# Patient Record
Sex: Male | Born: 1980 | Race: White | Hispanic: No
Health system: Midwestern US, Community
[De-identification: ages and names within clinical notes are randomized; demographics above are authoritative.]

## PROBLEM LIST (undated history)

## (undated) DIAGNOSIS — F1010 Alcohol abuse, uncomplicated: Secondary | ICD-10-CM

## (undated) DIAGNOSIS — R569 Unspecified convulsions: Secondary | ICD-10-CM

## (undated) DIAGNOSIS — Z720 Tobacco use: Secondary | ICD-10-CM

## (undated) DIAGNOSIS — N179 Acute kidney failure, unspecified: Secondary | ICD-10-CM

## (undated) DIAGNOSIS — C801 Malignant (primary) neoplasm, unspecified: Secondary | ICD-10-CM

---

## 2005-12-19 ENCOUNTER — Emergency Department (HOSPITAL_COMMUNITY): Admission: EM | Admit: 2005-12-19 | Discharge: 2005-12-19 | Payer: Self-pay | Admitting: Emergency Medicine

## 2006-03-21 ENCOUNTER — Emergency Department (HOSPITAL_COMMUNITY): Admission: EM | Admit: 2006-03-21 | Discharge: 2006-03-21 | Payer: Self-pay | Admitting: Emergency Medicine

## 2006-07-21 ENCOUNTER — Emergency Department (HOSPITAL_COMMUNITY): Admission: EM | Admit: 2006-07-21 | Discharge: 2006-07-21 | Payer: Self-pay | Admitting: Emergency Medicine

## 2006-10-26 ENCOUNTER — Emergency Department (HOSPITAL_COMMUNITY): Admission: EM | Admit: 2006-10-26 | Discharge: 2006-10-26 | Payer: Self-pay | Admitting: Emergency Medicine

## 2006-11-02 ENCOUNTER — Emergency Department (HOSPITAL_COMMUNITY): Admission: EM | Admit: 2006-11-02 | Discharge: 2006-11-02 | Payer: Self-pay | Admitting: Family Medicine

## 2006-11-11 ENCOUNTER — Emergency Department (HOSPITAL_COMMUNITY): Admission: EM | Admit: 2006-11-11 | Discharge: 2006-11-11 | Payer: Self-pay | Admitting: Emergency Medicine

## 2006-12-20 ENCOUNTER — Emergency Department (HOSPITAL_COMMUNITY): Admission: EM | Admit: 2006-12-20 | Discharge: 2006-12-20 | Payer: Self-pay | Admitting: Family Medicine

## 2007-01-04 ENCOUNTER — Emergency Department (HOSPITAL_COMMUNITY): Admission: EM | Admit: 2007-01-04 | Discharge: 2007-01-04 | Payer: Self-pay | Admitting: Family Medicine

## 2007-03-09 ENCOUNTER — Emergency Department (HOSPITAL_COMMUNITY): Admission: EM | Admit: 2007-03-09 | Discharge: 2007-03-09 | Payer: Self-pay | Admitting: Family Medicine

## 2007-08-23 ENCOUNTER — Emergency Department (HOSPITAL_COMMUNITY): Admission: EM | Admit: 2007-08-23 | Discharge: 2007-08-24 | Payer: Self-pay | Admitting: Emergency Medicine

## 2007-09-03 ENCOUNTER — Emergency Department (HOSPITAL_COMMUNITY): Admission: EM | Admit: 2007-09-03 | Discharge: 2007-09-03 | Payer: Self-pay | Admitting: Emergency Medicine

## 2007-12-06 DIAGNOSIS — C801 Malignant (primary) neoplasm, unspecified: Secondary | ICD-10-CM | POA: Diagnosis present

## 2007-12-06 HISTORY — PX: ORCHIECTOMY: SHX2116

## 2007-12-06 HISTORY — DX: Malignant (primary) neoplasm, unspecified: C80.1

## 2008-03-11 ENCOUNTER — Emergency Department (HOSPITAL_COMMUNITY): Admission: EM | Admit: 2008-03-11 | Discharge: 2008-03-11 | Payer: Self-pay | Admitting: Emergency Medicine

## 2008-11-01 ENCOUNTER — Emergency Department (HOSPITAL_COMMUNITY): Admission: EM | Admit: 2008-11-01 | Discharge: 2008-11-01 | Payer: Self-pay | Admitting: Emergency Medicine

## 2009-03-27 ENCOUNTER — Emergency Department (HOSPITAL_COMMUNITY): Admission: EM | Admit: 2009-03-27 | Discharge: 2009-03-27 | Payer: Self-pay | Admitting: Emergency Medicine

## 2009-05-02 ENCOUNTER — Emergency Department (HOSPITAL_COMMUNITY): Admission: EM | Admit: 2009-05-02 | Discharge: 2009-05-02 | Payer: Self-pay | Admitting: Emergency Medicine

## 2009-05-12 ENCOUNTER — Emergency Department (HOSPITAL_COMMUNITY): Admission: EM | Admit: 2009-05-12 | Discharge: 2009-05-12 | Payer: Self-pay | Admitting: Emergency Medicine

## 2009-11-19 ENCOUNTER — Emergency Department (HOSPITAL_COMMUNITY): Admission: EM | Admit: 2009-11-19 | Discharge: 2009-11-19 | Payer: Self-pay | Admitting: Emergency Medicine

## 2010-01-23 ENCOUNTER — Emergency Department (HOSPITAL_COMMUNITY): Admission: EM | Admit: 2010-01-23 | Discharge: 2010-01-23 | Payer: Self-pay | Admitting: Emergency Medicine

## 2010-07-16 ENCOUNTER — Emergency Department (HOSPITAL_COMMUNITY): Admission: EM | Admit: 2010-07-16 | Discharge: 2010-07-16 | Payer: Self-pay | Admitting: Emergency Medicine

## 2010-08-22 ENCOUNTER — Emergency Department (HOSPITAL_COMMUNITY): Admission: EM | Admit: 2010-08-22 | Discharge: 2010-08-22 | Payer: Self-pay | Admitting: Emergency Medicine

## 2010-12-26 ENCOUNTER — Encounter: Payer: Self-pay | Admitting: Urology

## 2011-02-18 LAB — URINALYSIS, ROUTINE W REFLEX MICROSCOPIC
Bilirubin Urine: NEGATIVE
Nitrite: NEGATIVE
Specific Gravity, Urine: 1.012 (ref 1.005–1.030)
pH: 7 (ref 5.0–8.0)

## 2011-03-14 LAB — CBC
HCT: 43.6 % (ref 39.0–52.0)
Hemoglobin: 14.8 g/dL (ref 13.0–17.0)
MCHC: 33.9 g/dL (ref 30.0–36.0)
MCV: 89.3 fL (ref 78.0–100.0)
Platelets: 237 K/uL (ref 150–400)
RBC: 4.88 MIL/uL (ref 4.22–5.81)
RDW: 14.4 % (ref 11.5–15.5)
WBC: 8.1 10*3/uL (ref 4.0–10.5)

## 2011-03-14 LAB — DIFFERENTIAL
Basophils Absolute: 0.1 K/uL (ref 0.0–0.1)
Basophils Relative: 1 % (ref 0–1)
Eosinophils Absolute: 0.1 K/uL (ref 0.0–0.7)
Eosinophils Relative: 1 % (ref 0–5)
Lymphocytes Relative: 19 % (ref 12–46)
Lymphs Abs: 1.5 10*3/uL (ref 0.7–4.0)
Monocytes Absolute: 0.5 10*3/uL (ref 0.1–1.0)
Monocytes Relative: 6 % (ref 3–12)
Neutro Abs: 5.9 K/uL (ref 1.7–7.7)
Neutrophils Relative %: 73 % (ref 43–77)

## 2011-03-14 LAB — URINE MICROSCOPIC-ADD ON

## 2011-03-14 LAB — URINALYSIS, ROUTINE W REFLEX MICROSCOPIC
Bilirubin Urine: NEGATIVE
Glucose, UA: NEGATIVE mg/dL
Ketones, ur: NEGATIVE mg/dL
Leukocytes, UA: NEGATIVE
Nitrite: NEGATIVE
Protein, ur: NEGATIVE mg/dL
Specific Gravity, Urine: 1.021 (ref 1.005–1.030)
Urobilinogen, UA: 0.2 mg/dL (ref 0.0–1.0)
pH: 5.5 (ref 5.0–8.0)

## 2011-03-14 LAB — URINE CULTURE
Colony Count: NO GROWTH
Culture: NO GROWTH

## 2011-03-14 LAB — BASIC METABOLIC PANEL WITH GFR
BUN: 12 mg/dL (ref 6–23)
CO2: 24 meq/L (ref 19–32)
Calcium: 10.2 mg/dL (ref 8.4–10.5)
Chloride: 110 meq/L (ref 96–112)
Creatinine, Ser: 1.06 mg/dL (ref 0.4–1.5)
GFR calc Af Amer: >60 mL/min (ref >60)

## 2011-03-14 LAB — BASIC METABOLIC PANEL
GFR calc non Af Amer: >60 mL/min (ref >60)
Glucose, Bld: 95 mg/dL (ref 70–99)
Potassium: 4.1 mEq/L (ref 3.5–5.1)
Sodium: 142 mEq/L (ref 135–145)

## 2011-04-02 ENCOUNTER — Emergency Department (HOSPITAL_COMMUNITY)
Admission: EM | Admit: 2011-04-02 | Discharge: 2011-04-02 | Disposition: A | Discharge status: Home / Self Care | Payer: Self-pay | Attending: Emergency Medicine | Admitting: Emergency Medicine

## 2011-04-02 DIAGNOSIS — M545 Low back pain, unspecified: Secondary | ICD-10-CM | POA: Insufficient documentation

## 2011-04-02 DIAGNOSIS — M5430 Sciatica, unspecified side: Secondary | ICD-10-CM | POA: Insufficient documentation

## 2011-04-02 DIAGNOSIS — Z87442 Personal history of urinary calculi: Secondary | ICD-10-CM | POA: Insufficient documentation

## 2011-04-05 ENCOUNTER — Emergency Department (HOSPITAL_COMMUNITY): Payer: Self-pay

## 2011-04-05 ENCOUNTER — Emergency Department (HOSPITAL_COMMUNITY)
Admission: EM | Admit: 2011-04-05 | Discharge: 2011-04-05 | Disposition: A | Discharge status: Home / Self Care | Payer: Self-pay | Attending: Emergency Medicine | Admitting: Emergency Medicine

## 2011-04-05 DIAGNOSIS — W219XXA Striking against or struck by unspecified sports equipment, initial encounter: Secondary | ICD-10-CM | POA: Insufficient documentation

## 2011-04-05 DIAGNOSIS — M545 Low back pain, unspecified: Secondary | ICD-10-CM | POA: Insufficient documentation

## 2011-04-05 DIAGNOSIS — Y9361 Activity, american tackle football: Secondary | ICD-10-CM | POA: Insufficient documentation

## 2011-04-05 DIAGNOSIS — Y92838 Other recreation area as the place of occurrence of the external cause: Secondary | ICD-10-CM | POA: Insufficient documentation

## 2011-04-05 DIAGNOSIS — G8929 Other chronic pain: Secondary | ICD-10-CM | POA: Insufficient documentation

## 2011-04-05 DIAGNOSIS — M549 Dorsalgia, unspecified: Secondary | ICD-10-CM | POA: Insufficient documentation

## 2011-04-05 DIAGNOSIS — M5430 Sciatica, unspecified side: Secondary | ICD-10-CM | POA: Insufficient documentation

## 2011-04-05 DIAGNOSIS — Y9239 Other specified sports and athletic area as the place of occurrence of the external cause: Secondary | ICD-10-CM | POA: Insufficient documentation

## 2011-04-05 DIAGNOSIS — M79609 Pain in unspecified limb: Secondary | ICD-10-CM | POA: Insufficient documentation

## 2011-08-30 LAB — CBC
HCT: 44.1
Hemoglobin: 15.6
MCHC: 35.5
MCV: 90.3
Platelets: 232
RDW: 12.5

## 2011-08-30 LAB — DIFFERENTIAL
Basophils Absolute: 0.1
Eosinophils Absolute: 0.2
Eosinophils Relative: 2
Monocytes Absolute: 0.5

## 2011-08-30 LAB — URINALYSIS, ROUTINE W REFLEX MICROSCOPIC
Bilirubin Urine: NEGATIVE
Hgb urine dipstick: NEGATIVE
Ketones, ur: NEGATIVE
Nitrite: NEGATIVE
pH: 7

## 2011-08-30 LAB — BASIC METABOLIC PANEL
BUN: 9
CO2: 23
Chloride: 103
GFR calc non Af Amer: >60
Glucose, Bld: 117 — ABNORMAL HIGH
Potassium: 3.9
Sodium: 135

## 2011-09-06 LAB — URINALYSIS, ROUTINE W REFLEX MICROSCOPIC
Bilirubin Urine: NEGATIVE
Ketones, ur: NEGATIVE
Nitrite: NEGATIVE
Protein, ur: NEGATIVE
pH: 6

## 2011-09-06 LAB — URINE CULTURE
Colony Count: NO GROWTH
Culture: NO GROWTH

## 2015-06-26 ENCOUNTER — Inpatient Hospital Stay
Admit: 2015-06-26 | Discharge: 2015-06-26 | Disposition: A | Discharge status: Home / Self Care | Payer: Self-pay | Attending: Emergency Medicine

## 2015-06-26 DIAGNOSIS — J029 Acute pharyngitis, unspecified: Secondary | ICD-10-CM

## 2015-06-26 MED ORDER — PREDNISONE 10 MG TAB
10 mg | ORAL_TABLET | Freq: Every day | ORAL | Status: DC
Start: 2015-06-26 — End: 2015-07-13

## 2015-06-26 MED ORDER — CLINDAMYCIN 150 MG CAP
150 mg | ORAL_CAPSULE | Freq: Four times a day (QID) | ORAL | Status: AC
Start: 2015-06-26 — End: 2015-07-06

## 2015-06-26 NOTE — ED Notes (Signed)
Pt states that he has a sore throat and difficulty swallowing.

## 2015-06-26 NOTE — ED Notes (Signed)
I have reviewed discharge instructions with the patient.  The patient verbalized understanding.

## 2015-06-26 NOTE — ED Provider Notes (Signed)
HPI Comments: Patient states that he started with a sore throat yesterday and has had some slight difficulty swallowing.  He has had 2 tonsillar abscesses in the past and is concerned about that. He has had no fever, nausea, vomiting, headache, chest pain, shortness of breath, abdominal pain, swelling of his arms or legs or other symptoms.  He has had trouble eating and drinking.    Patient is a 34 y.o. male presenting with sore throat. The history is provided by the patient.   Sore Throat          Past Medical History:   Diagnosis Date   ??? Ill-defined condition      restless legs   ??? Cancer [facility](HCC)      testiclar cancer       Past Surgical History:   Procedure Laterality Date   ??? Hx other surgical       kidney stones removal.   ??? Hx urological       left testicle removal.         History reviewed. No pertinent family history.    History     Social History   ??? Marital Status: SINGLE     Spouse Name: N/A   ??? Number of Children: N/A   ??? Years of Education: N/A     Occupational History   ??? Not on file.     Social History Main Topics   ??? Smoking status: Current Every Day Smoker -- 1.00 packs/day   ??? Smokeless tobacco: Not on file   ??? Alcohol Use: No   ??? Drug Use: Not on file   ??? Sexual Activity: Not on file     Other Topics Concern   ??? Not on file     Social History Narrative   ??? No narrative on file         ALLERGIES: Review of patient's allergies indicates not on file.    Review of Systems   Constitutional: Negative.    HENT: Positive for sore throat.    Eyes: Negative.    Respiratory: Negative.    Cardiovascular: Negative.    Gastrointestinal: Negative.    Genitourinary: Negative.    Musculoskeletal: Negative.    Skin: Negative.    Neurological: Negative.    Psychiatric/Behavioral: Negative.    All other systems reviewed and are negative.      Filed Vitals:    06/26/15 1110   BP: 127/79   Pulse: 107   Temp: 98.1 ??F (36.7 ??C)   Resp: 16   Height: 5\' 11"  (1.803 m)   Weight: 90.719 kg (200 lb)   SpO2: 100%             Physical Exam   Constitutional: He is oriented to person, place, and time. He appears well-developed and well-nourished.   HENT:   Head: Normocephalic and atraumatic.   Right Ear: External ear normal.   Left Ear: External ear normal.   Nose: Nose normal.   Mouth/Throat: Oropharynx is clear and moist.       Eyes: Conjunctivae and EOM are normal. Pupils are equal, round, and reactive to light.   Neck: Normal range of motion. Neck supple.   Cardiovascular: Normal rate, regular rhythm, normal heart sounds and intact distal pulses.    Pulmonary/Chest: Effort normal and breath sounds normal.   Abdominal: Soft. Bowel sounds are normal.   Musculoskeletal: Normal range of motion.   Neurological: He is alert and oriented to person, place, and time. He  has normal reflexes.   Skin: Skin is warm and dry.   Psychiatric: He has a normal mood and affect. His behavior is normal. Judgment and thought content normal.   Nursing note and vitals reviewed.       MDM  Number of Diagnoses or Management Options  Acute pharyngitis, unspecified etiology: minor  Risk of Complications, Morbidity, and/or Mortality  Presenting problems: low  Diagnostic procedures: low  Management options: low    Patient Progress  Patient progress: stable      Procedures  The patient was observed in the ED.  I will treat prophylactically for strep based on the appearance of his throat and/or tonsillitis.  He should follow-up with an ear nose and throat doctor if not doing any better.  Return to the ED if worse.  Drink plenty of fluids.  I discussed the results of all labs, procedures, radiographs, and treatments with the patient and available family.  Treatment plan is agreed upon and the patient is ready for discharge.  All voiced understanding of the discharge plan and medication instructions or changes as appropriate.  Questions about treatment in the ED were answered.  All were encouraged to return should symptoms worsen or new problems develop.

## 2015-07-13 ENCOUNTER — Inpatient Hospital Stay
Admit: 2015-07-13 | Discharge: 2015-07-13 | Disposition: A | Discharge status: Home / Self Care | Payer: Self-pay | Attending: Emergency Medicine

## 2015-07-13 ENCOUNTER — Emergency Department: Admit: 2015-07-13 | Payer: Self-pay

## 2015-07-13 DIAGNOSIS — R109 Unspecified abdominal pain: Secondary | ICD-10-CM

## 2015-07-13 LAB — METABOLIC PANEL, BASIC
Anion gap: 3 mmol/L — ABNORMAL LOW (ref 7–16)
BUN: 11 MG/DL (ref 6–23)
CO2: 27 mmol/L (ref 21–32)
Calcium: 8.4 MG/DL (ref 8.3–10.4)
Chloride: 107 mmol/L (ref 98–107)
Creatinine: 1.18 MG/DL (ref 0.8–1.5)
GFR est AA: >60 mL/min/{1.73_m2} (ref >60)
GFR est non-AA: >60 mL/min/{1.73_m2} (ref >60)
Glucose: 88 mg/dL (ref 65–100)
Potassium: 4.7 mmol/L (ref 3.5–5.1)
Sodium: 137 mmol/L (ref 136–145)

## 2015-07-13 LAB — CBC W/O DIFF
HCT: 38.9 % — ABNORMAL LOW (ref 41.1–50.3)
HGB: 11.8 g/dL — ABNORMAL LOW (ref 13.6–17.2)
MCH: 24.5 PG — ABNORMAL LOW (ref 26.1–32.9)
MCHC: 30.3 g/dL — ABNORMAL LOW (ref 31.4–35.0)
MCV: 80.9 FL (ref 79.6–97.8)
MPV: 11.3 FL (ref 10.8–14.1)
PLATELET: 287 10*3/uL (ref 150–450)
RBC: 4.81 M/uL (ref 4.23–5.67)
RDW: 18.5 % — ABNORMAL HIGH (ref 11.9–14.6)
WBC: 8.1 10*3/uL (ref 4.3–11.1)

## 2015-07-13 LAB — URINALYSIS W/ RFLX MICROSCOPIC
Bilirubin: NEGATIVE
Glucose: NEGATIVE mg/dL
Ketone: NEGATIVE mg/dL
Leukocyte Esterase: NEGATIVE
Nitrites: NEGATIVE
RBC: >100 /hpf
Specific gravity: 1.016 (ref 1.001–1.023)
Urobilinogen: 0.2 EU/dL (ref 0.2–1.0)
pH (UA): 6 (ref 5.0–9.0)

## 2015-07-13 MED ORDER — TRAMADOL 50 MG TAB
50 mg | ORAL_TABLET | Freq: Three times a day (TID) | ORAL | Status: DC | PRN
Start: 2015-07-13 — End: 2015-08-21

## 2015-07-13 MED ORDER — ONDANSETRON 4 MG TAB, RAPID DISSOLVE
4 mg | ORAL | Status: AC
Start: 2015-07-13 — End: 2015-07-13
  Administered 2015-07-13: 15:00:00 via ORAL

## 2015-07-13 MED ORDER — HYDROCODONE-ACETAMINOPHEN 5 MG-325 MG TAB
5-325 mg | Freq: Once | ORAL | Status: AC
Start: 2015-07-13 — End: 2015-07-13
  Administered 2015-07-13: 15:00:00 via ORAL

## 2015-07-13 MED FILL — HYDROCODONE-ACETAMINOPHEN 5 MG-325 MG TAB: 5-325 mg | ORAL | Qty: 1

## 2015-07-13 MED FILL — ONDANSETRON 4 MG TAB, RAPID DISSOLVE: 4 mg | ORAL | Qty: 1

## 2015-07-13 NOTE — ED Notes (Signed)
I have reviewed discharge instructions with the patient.  The patient verbalized understanding. Ambulatory to exit to lobby to call his ride with steady gait.

## 2015-07-13 NOTE — ED Notes (Signed)
Patient gone to xray. Will draw labs upon return.

## 2015-07-13 NOTE — ED Provider Notes (Signed)
HPI Comments: 33 year old gentleman with a history of kidney stones and a remote history of testicular cancer who presents with concerns about right flank pain.  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  He says he will not return to [place] until November.  Patient says that he does have some blood in his urine but that has been intermittently chronic problem since his original orchiectomy.  With his pain patient says that he has not had any fevers, vomiting, diarrhea.    Elements of this note were created using speech recognition software. As such, there may be errors of speech recognition present.    Patient is a 34 y.o. male presenting with flank pain. The history is provided by the patient.   Flank Pain   Pertinent negatives include no chest pain, no fever, no headaches, no abdominal pain, no dysuria and no weakness.        Past Medical History:   Diagnosis Date   ??? Ill-defined condition      restless legs   ??? Cancer [facility])      testiclar cancer       Past Surgical History:   Procedure Laterality Date   ??? Hx other surgical       kidney stones removal.   ??? Hx urological       left testicle removal.         History reviewed. No pertinent family history.    History     Social History   ??? Marital Status: SINGLE     Spouse Name: N/A   ??? Number of Children: N/A   ??? Years of Education: N/A     Occupational History   ??? Not on file.     Social History Main Topics   ??? Smoking status: Current Every Day Smoker -- 1.00 packs/day   ??? Smokeless tobacco: Not on file   ??? Alcohol Use: No   ??? Drug Use: Not on file   ??? Sexual Activity: Not on file     Other Topics Concern   ??? Not on file     Social History Narrative         ALLERGIES: Review of patient's allergies indicates no known allergies.    Review of Systems   Constitutional: Negative for fever, chills and diaphoresis.    HENT: Negative for congestion, rhinorrhea and sore throat.    Eyes: Negative for redness and visual disturbance.   Respiratory: Negative for cough, chest tightness, shortness of breath and wheezing.    Cardiovascular: Negative for chest pain and palpitations.   Gastrointestinal: Negative for nausea, vomiting, abdominal pain, diarrhea and blood in stool.   Endocrine: Negative for polydipsia and polyuria.   Genitourinary: Positive for flank pain. Negative for dysuria and hematuria.   Musculoskeletal: Negative for myalgias, arthralgias and neck stiffness.   Skin: Negative for rash.   Allergic/Immunologic: Negative for environmental allergies and food allergies.   Neurological: Negative for dizziness, weakness and headaches.   Hematological: Negative for adenopathy. Does not bruise/bleed easily.   Psychiatric/Behavioral: Negative for confusion and sleep disturbance. The patient is not nervous/anxious.        Filed Vitals:    07/13/15 0912   BP: 124/78   Pulse: 74   Temp: 97.8 ??F (36.6 ??C)   Resp: 18   Height: 5\' 11"  (1.803 m)   Weight: 92.987 kg (205 lb)  SpO2: 99%            Physical Exam   Constitutional: He is oriented to person, place, and time. He appears well-developed and well-nourished.   HENT:   Head: Normocephalic and atraumatic.   Eyes: Conjunctivae and EOM are normal. Pupils are equal, round, and reactive to light.   Neck: Normal range of motion.   Cardiovascular: Normal rate and regular rhythm.    Pulmonary/Chest: Effort normal. No respiratory distress. He has wheezes. He has no rales. He exhibits no tenderness.   Scant skin bilateral wheezes   Abdominal: Soft. Bowel sounds are normal. There is no rebound and no guarding.   Musculoskeletal: Normal range of motion. He exhibits no edema or tenderness.   Lymphadenopathy:     He has no cervical adenopathy.   Neurological: He is alert and oriented to person, place, and time.   Skin: Skin is warm and dry.   Psychiatric: He has a normal mood and affect.    Nursing note and vitals reviewed.       MDM  Number of Diagnoses or Management Options  Diagnosis management comments: Patient had 2 recent CT scans at [facility] for which showed a 5 mm intrarenal stone on the right no ureteral stones.  Given the blood that was in his urine dip we will get a urine microscopic we'll also check his BUN and creatinine as well as a KUB to look for a ureteral stone.      Procedures

## 2015-07-13 NOTE — Progress Notes (Signed)
Was going to meet with patient regarding Access Health(program for uninsured [facility] residents) but patient is from [facility] which makes him ineligible for this program.

## 2015-07-13 NOTE — Med Student Progress Note (Signed)
07/13/15  1030  *ATTENTION:  This note has been created by a medical student for educational purposes only.  Please do not refer to the content of this note for clinical decision-making, billing, or other purposes.  Please see attending physician???s note to obtain clinical information on this patient.*   S:    CC:  R Flank pain  HPI - Mr. Dwayne Townsend a 34yo caucasian male presents with flank pain to the ER of 12hrs duration.  His pain began last night at approx. 2030.  It is stabbing, throbbing in nature and is in his R flank.  The pain does radiate to his R inguinal region.  He took two (2) goody's powders last night without relief.  The pain is a 6/10.  This is a recurrent problem.  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  ROS positive for hematuria.  Negative for fever, chest pain, dyspnea, dizziness, CVA tenderness, dysuria, melena, hematochezia, or lower extremities paresthesia.  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    Surgical Hx - L orchiectomy 2009, lithotripsy x2, ureteral stents x 3  PMH - testicular cancer 2009, nephrolithiasis (multi)  Meds - ropinirole  Allergies - NKDA  FH - testicular cancer in 1st cousin, Mother and father DM type 2  Social Hx - current 1/2ppd smoker, no alcoholuse or illicit drug use.      O:  VS 124/78 HR 74 RR 18 T97.8 PO2 99% on RA  CV - RRR, normal S1/S2.  No murmurs, rubs or gallops.  +2 radial pulse UE b/l.    Lungs - CTAB.  No rales or rhonchi, + b/l wheeze  Abdomen - soft, nontender, nondistended.  +BSx4.  No hepatosplenomegaly.  No abdominal bruit.  Back - no ecchymosis or erythema lumbar region.  No costovertebral angle tenderness    A:  1)  Chronic nephrolithiasis  2)  Cystitis - r/o  3)  Pyelonephritis - r/o  4)  Tumor (bladder, renal or prostate) - doubt    P:  - Norco 5/325 for pain.     - UA, CBC, BUN, Cr, KUB for R flank pain  - Follow up with urologist here local in [place] or at home in [place]  - Return to ER for fever, vomiting or increased bleeding.

## 2015-07-13 NOTE — ED Notes (Signed)
Reports intermittent right flank pain since last night.  States urine is a dark brown-red color.

## 2015-07-28 DIAGNOSIS — F12929 Cannabis use, unspecified with intoxication, unspecified: Secondary | ICD-10-CM

## 2015-07-29 ENCOUNTER — Inpatient Hospital Stay
Admit: 2015-07-29 | Discharge: 2015-07-29 | Disposition: A | Discharge status: Home / Self Care | Payer: Self-pay | Attending: Emergency Medicine

## 2015-07-29 ENCOUNTER — Emergency Department: Admit: 2015-07-29 | Payer: Self-pay

## 2015-07-29 LAB — CBC WITH AUTOMATED DIFF
ABS. BASOPHILS: 0 10*3/uL (ref 0.0–0.2)
ABS. EOSINOPHILS: 0.1 10*3/uL (ref 0.0–0.8)
ABS. IMM. GRANS.: 0 10*3/uL (ref 0.0–0.5)
ABS. LYMPHOCYTES: 1.8 10*3/uL (ref 0.5–4.6)
ABS. MONOCYTES: 0.5 10*3/uL (ref 0.1–1.3)
ABS. NEUTROPHILS: 4.7 10*3/uL (ref 1.7–8.2)
BASOPHILS: 0 % (ref 0.0–2.0)
EOSINOPHILS: 1 % (ref 0.5–7.8)
HCT: 40.6 % — ABNORMAL LOW (ref 41.1–50.3)
HGB: 12.9 g/dL — ABNORMAL LOW (ref 13.6–17.2)
IMMATURE GRANULOCYTES: 0.1 % (ref 0.0–5.0)
LYMPHOCYTES: 26 % (ref 13–44)
MCH: 25.6 PG — ABNORMAL LOW (ref 26.1–32.9)
MCHC: 31.8 g/dL (ref 31.4–35.0)
MCV: 80.6 FL (ref 79.6–97.8)
MONOCYTES: 7 % (ref 4.0–12.0)
MPV: 11.8 FL (ref 10.8–14.1)
NEUTROPHILS: 66 % (ref 43–78)
PLATELET: 279 10*3/uL (ref 150–450)
RBC: 5.04 M/uL (ref 4.23–5.67)
RDW: 18.8 % — ABNORMAL HIGH (ref 11.9–14.6)
WBC: 7.1 10*3/uL (ref 4.3–11.1)

## 2015-07-29 LAB — METABOLIC PANEL, COMPREHENSIVE
A-G Ratio: 1.1 — ABNORMAL LOW (ref 1.2–3.5)
ALT (SGPT): 19 U/L (ref 12–65)
AST (SGOT): 15 U/L (ref 15–37)
Albumin: 4.1 g/dL (ref 3.5–5.0)
Alk. phosphatase: 95 U/L (ref 50–136)
Anion gap: 9 mmol/L (ref 7–16)
BUN: 19 MG/DL (ref 6–23)
Bilirubin, total: 0.3 MG/DL (ref 0.2–1.1)
CO2: 22 mmol/L (ref 21–32)
Calcium: 8.7 MG/DL (ref 8.3–10.4)
Chloride: 107 mmol/L (ref 98–107)
Creatinine: 1.52 MG/DL — ABNORMAL HIGH (ref 0.8–1.5)
GFR est AA: >60 mL/min/{1.73_m2} (ref >60)
GFR est non-AA: 56 mL/min/{1.73_m2} — ABNORMAL LOW (ref >60)
Globulin: 3.6 g/dL — ABNORMAL HIGH (ref 2.3–3.5)
Glucose: 124 mg/dL — ABNORMAL HIGH (ref 65–100)
Potassium: 3.7 mmol/L (ref 3.5–5.1)
Protein, total: 7.7 g/dL (ref 6.3–8.2)
Sodium: 138 mmol/L (ref 136–145)

## 2015-07-29 LAB — ETHYL ALCOHOL: ALCOHOL(ETHYL),SERUM: 47 MG/DL

## 2015-07-29 LAB — GLUCOSE, POC: Glucose (POC): 124 mg/dL — ABNORMAL HIGH (ref 65–100)

## 2015-07-29 MED ORDER — SODIUM CHLORIDE 0.9 % IV
Freq: Once | INTRAVENOUS | Status: DC
Start: 2015-07-29 — End: 2015-07-29

## 2015-07-29 NOTE — ED Notes (Signed)
Discharged to lobby.  Discharge instructions reviewed with understanding voiced.  Ambulatory with steady gait noted.

## 2015-07-29 NOTE — ED Notes (Signed)
Patient states "Other than feeling lightheaded, I'm feeling ok"

## 2015-07-29 NOTE — ED Provider Notes (Signed)
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  He reports he stopped drinking 2 days ago.  He was out walking around and smoked some marijuana tonight and was at a gas station where he collapsed and had a seizure.  Patient was brought to the emergency department for evaluation.  Patient remains inebriated acting.  His speech is slurred.  Blood sugar is normal.    Patient is a 34 y.o. male presenting with seizures. The history is provided by the patient.   Seizure    This is a new problem. The current episode started 1 to 2 hours ago. The problem has been resolved. There was 1 seizure. Duration: unknown. unknown Witnessed: unknown. There was no sensation of an aura present. There was no return to baseline postseizure. The seizures did not continue in the ED. Possible causes include change in alcohol use. There has been no fever.         Past Medical History:   Diagnosis Date   ??? Cancer ([place])      testiclar cancer   ??? Ill-defined condition      restless legs       Past Surgical History:   Procedure Laterality Date   ??? Hx other surgical       kidney stones removal.   ??? Hx urological       left testicle removal.         No family history on file.    Social History     Social History   ??? Marital status: SINGLE     Spouse name: N/A   ??? Number of children: N/A   ??? Years of education: N/A     Occupational History   ??? Not on file.     Social History Main Topics   ??? Smoking status: Current Every Day Smoker     Packs/day: 1.00   ??? Smokeless tobacco: Not on file   ??? Alcohol use No   ??? Drug use: Not on file   ??? Sexual activity: Not on file     Other Topics Concern   ??? Not on file     Social History Narrative         ALLERGIES: Tramadol    Review of Systems   Unable to perform ROS: Mental status change       Vitals:    07/29/15 0015   BP: 123/78   Pulse: (!) 127   Resp: 20   Temp: 98.3 ??F (36.8 ??C)   SpO2: 99%   Weight: 88.5 kg (195 lb)   Height: 5' 10" (1.778 m)             Physical Exam   Constitutional: He appears well-developed and well-nourished.   HENT:   Head: Normocephalic and atraumatic.   Mouth/Throat: Oropharynx is clear and moist. No oropharyngeal exudate.   Eyes: Conjunctivae are normal. Pupils are equal, round, and reactive to light.   Neck: Neck supple.   Cardiovascular: Normal rate, regular rhythm and normal heart sounds.    Pulmonary/Chest: Effort normal and breath sounds normal.   Abdominal: Soft. Bowel sounds are normal. He exhibits no distension. There is no tenderness. There is no rebound and no guarding.   Musculoskeletal: Normal range of motion. He exhibits no edema or tenderness.   Lymphadenopathy:     He has no cervical adenopathy.   Neurological:   Somnolent but arouses to voice.  Speech slurred.  Skin: Skin is warm and dry.   Nursing note and vitals reviewed.       MDM  Number of Diagnoses or Management Options  Diagnosis management comments: No sign of trauma and patient denies injury.  I see no sign of injury.  CT scan ordered.  Labs ordered.  When care glucose is normal.  Probable alcohol withdrawal seizure.  Patient denies any hallucinations.  Patient will be monitored closely for DT's.       Amount and/or Complexity of Data Reviewed  Clinical lab tests: ordered and reviewed (Results for orders placed or performed during the hospital encounter of 07/29/15  -CBC WITH AUTOMATED DIFF       Result                                            Value                         Ref Range                       WBC                                               7.1                           4.3 - 11.1 K/uL                 RBC                                               5.04                          4.23 - 5.67 M/uL                HGB                                               12.9 (L)                      13.6 - 17.2 g/dL                HCT                                               40.6 (L)                      41.1 - 50.3 %                    MCV  80.6                          79.6 - 97.8 FL                  MCH                                               25.6 (L)                      26.1 - 32.9 PG                  MCHC                                              31.8                          31.4 - 35.0 g/dL                RDW                                               18.8 (H)                      11.9 - 14.6 %                   PLATELET                                          279                           150 - 450 K/uL                  MPV                                               11.8                          10.8 - 14.1 FL                  DF                                                AUTOMATED  NEUTROPHILS                                       66                            43 - 78 %                       LYMPHOCYTES                                       26                            13 - 44 %                       MONOCYTES                                         7                             4.0 - 12.0 %                    EOSINOPHILS                                       1                             0.5 - 7.8 %                     BASOPHILS                                         0                             0.0 - 2.0 %                     IMMATURE GRANULOCYTES                             0.1                           0.0 - 5.0 %                     ABS. NEUTROPHILS                                  4.7  1.7 - 8.2 K/UL                  ABS. LYMPHOCYTES                                  1.8                           0.5 - 4.6 K/UL                  ABS. MONOCYTES                                    0.5                           0.1 - 1.3 K/UL                  ABS. EOSINOPHILS                                  0.1                           0.0 - 0.8 K/UL                   ABS. BASOPHILS                                    0.0                           0.0 - 0.2 K/UL                  ABS. IMM. GRANS.                                  0.0                           0.0 - 0.5 K/UL             -METABOLIC PANEL, COMPREHENSIVE       Result                                            Value                         Ref Range                       Sodium                                            138  136 - 145 mmol/L                Potassium                                         3.7                           3.5 - 5.1 mmol/L                Chloride                                          107                           98 - 107 mmol/L                 CO2                                               22                            21 - 32 mmol/L                  Anion gap                                         9                             7 - 16 mmol/L                   Glucose                                           124 (H)                       65 - 100 mg/dL                  BUN                                               19                            6 - 23 MG/DL                    Creatinine  1.52 (H)                      0.8 - 1.5 MG/DL                 GFR est AA                                        >60                           >60 ml/min/1.73m               GFR est non-AA                                    56 (L)                        >60 ml/min/1.756m              Calcium                                           8.7                           8.3 - 10.4 MG/DL                Bilirubin, total                                  0.3                           0.2 - 1.1 MG/DL                 ALT                                               19                            12 - 65 U/L                     AST                                               15                            15 - 37 U/L                      Alk. phosphatase  95                            50 - 136 U/L                    Protein, total                                    7.7                           6.3 - 8.2 g/dL                  Albumin                                           4.1                           3.5 - 5.0 g/dL                  Globulin                                          3.6 (H)                       2.3 - 3.5 g/dL                  A-G Ratio                                         1.1 (L)                       1.2 - 3.5                  -ETHYL ALCOHOL       Result                                            Value                         Ref Range                       ALCOHOL(ETHYL),SERUM                              47                            MG/DL                      -GLUCOSE, POC       Result  Value                         Ref Range                       Glucose (POC)                                     124 (H)                       65 - 100 mg/dL             )  Tests in the radiology section of CPT??: ordered and reviewed (Ct Head Wo Cont    Result Date: 07/29/2015  Noncontrast CT of the brain.   COMPARISON: none  INDICATION: Seizure  TECHNIQUE: Contiguous axial images were obtained from the skull base through the vertex without IV contrast. Radiation dose reduction techniques were used for this study:  Our CT scanners use one or all of the following: Automated exposure control, adjustment of the mA and/or kVp according to patient's size, iterative reconstruction.  FINDINGS:  There is no acute intracranial hemorrhage or evidence for acute territorial infarction. There is no mass effect, midline shift or hydrocephalus. There is no extra-axial fluid collection. The cerebellum and brainstem are grossly unremarkable.  Visualized orbits and the globes are intact. Maxillary sinuses and the frontal sinuses appear hypoplastic. Mastoid air  cells are aerated. Surrounding bones are intact.     IMPRESSION:  No acute intracranial abnormality. No mass effect.     )      ED Course       Procedures

## 2015-07-29 NOTE — ED Notes (Signed)
Pt removed IV.  Dr. Eldridge AbrahamsMewborn aware.  Instructed to ensure pt get CT but do not restart IV at this time.

## 2015-07-29 NOTE — ED Notes (Signed)
Resting quietly with eyes closed.  No distress noted.

## 2015-07-29 NOTE — ED Triage Notes (Addendum)
Pt arrives via [facility] for possible seizure like activity. Denies hx seizures however states allergic to tramadol due to causing seizures. Per ems pt walking to store at time of seizure. Per ems seizure witnessed by bystanders. Picked up from parking lot of [employer]. Pt stopped drinking 2 days ago, admits to daily drinking. Slurred speech on arrival, denies etoh or drugs however pupils pinpoint on arrival.  Pt with hx RLS, unable to remain still during triage. Pt out of [place] x4 days. [place], lives in [place].

## 2015-07-29 NOTE — ED Notes (Signed)
To CT via stretcher

## 2015-07-30 ENCOUNTER — Inpatient Hospital Stay
Admit: 2015-07-30 | Discharge: 2015-08-01 | Disposition: A | Discharge status: Home / Self Care | Payer: Self-pay | Attending: Student in an Organized Health Care Education/Training Program

## 2015-07-30 DIAGNOSIS — T43592A Poisoning by other antipsychotics and neuroleptics, intentional self-harm, initial encounter: Secondary | ICD-10-CM

## 2015-07-30 LAB — CBC WITH AUTOMATED DIFF
ABS. BASOPHILS: 0 10*3/uL (ref 0.0–0.2)
ABS. EOSINOPHILS: 0.1 10*3/uL (ref 0.0–0.8)
ABS. IMM. GRANS.: 0 10*3/uL (ref 0.0–0.5)
ABS. LYMPHOCYTES: 1.3 10*3/uL (ref 0.5–4.6)
ABS. MONOCYTES: 0.4 10*3/uL (ref 0.1–1.3)
ABS. NEUTROPHILS: 4.5 10*3/uL (ref 1.7–8.2)
BASOPHILS: 1 % (ref 0.0–2.0)
EOSINOPHILS: 1 % (ref 0.5–7.8)
HCT: 38.9 % — ABNORMAL LOW (ref 41.1–50.3)
HGB: 11.8 g/dL — ABNORMAL LOW (ref 13.6–17.2)
IMMATURE GRANULOCYTES: 0.2 % (ref 0.0–5.0)
LYMPHOCYTES: 20 % (ref 13–44)
MCH: 24.6 PG — ABNORMAL LOW (ref 26.1–32.9)
MCHC: 30.3 g/dL — ABNORMAL LOW (ref 31.4–35.0)
MCV: 81.2 FL (ref 79.6–97.8)
MONOCYTES: 7 % (ref 4.0–12.0)
MPV: 12.3 FL (ref 10.8–14.1)
NEUTROPHILS: 71 % (ref 43–78)
PLATELET: 251 10*3/uL (ref 150–450)
RBC: 4.79 M/uL (ref 4.23–5.67)
RDW: 18.6 % — ABNORMAL HIGH (ref 11.9–14.6)
WBC: 6.3 10*3/uL (ref 4.3–11.1)

## 2015-07-30 LAB — DRUG SCREEN, URINE
AMPHETAMINES: NEGATIVE
BARBITURATES: NEGATIVE
BENZODIAZEPINES: NEGATIVE
COCAINE: POSITIVE
METHADONE: NEGATIVE
OPIATES: NEGATIVE
PCP(PHENCYCLIDINE): NEGATIVE
THC (TH-CANNABINOL): NEGATIVE

## 2015-07-30 LAB — METABOLIC PANEL, COMPREHENSIVE
A-G Ratio: 1.2 (ref 1.2–3.5)
ALT (SGPT): 17 U/L (ref 12–65)
AST (SGOT): 11 U/L — ABNORMAL LOW (ref 15–37)
Albumin: 3.6 g/dL (ref 3.5–5.0)
Alk. phosphatase: 83 U/L (ref 50–136)
Anion gap: 6 mmol/L — ABNORMAL LOW (ref 7–16)
BUN: 12 MG/DL (ref 6–23)
Bilirubin, total: 0.4 MG/DL (ref 0.2–1.1)
CO2: 25 mmol/L (ref 21–32)
Calcium: 8.4 MG/DL (ref 8.3–10.4)
Chloride: 107 mmol/L (ref 98–107)
Creatinine: 1.25 MG/DL (ref 0.8–1.5)
GFR est AA: >60 mL/min/{1.73_m2} (ref >60)
GFR est non-AA: >60 mL/min/{1.73_m2} (ref >60)
Globulin: 3.1 g/dL (ref 2.3–3.5)
Glucose: 89 mg/dL (ref 65–100)
Potassium: 4.1 mmol/L (ref 3.5–5.1)
Protein, total: 6.7 g/dL (ref 6.3–8.2)
Sodium: 138 mmol/L (ref 136–145)

## 2015-07-30 LAB — EKG, 12 LEAD, INITIAL
Atrial Rate: 51 {beats}/min
Calculated P Axis: 51 degrees
Calculated R Axis: 81 degrees
Calculated T Axis: 46 degrees
P-R Interval: 158 ms
Q-T Interval: 420 ms
QRS Duration: 88 ms
QTC Calculation (Bezet): 387 ms
Ventricular Rate: 51 {beats}/min

## 2015-07-30 LAB — ACETAMINOPHEN: Acetaminophen level: <2 ug/mL — ABNORMAL LOW (ref 10.0–30.0)

## 2015-07-30 MED ORDER — FOLIC ACID 1 MG TAB
1 mg | Freq: Every day | ORAL | Status: DC
Start: 2015-07-30 — End: 2015-07-30

## 2015-07-30 MED ORDER — LORAZEPAM 1 MG TAB
1 mg | Freq: Four times a day (QID) | ORAL | Status: DC
Start: 2015-07-30 — End: 2015-07-30

## 2015-07-30 MED ORDER — LORAZEPAM 2 MG/ML IJ SOLN
2 mg/mL | INTRAMUSCULAR | Status: AC | PRN
Start: 2015-07-30 — End: 2015-07-30
  Administered 2015-07-30: 15:00:00 via INTRAVENOUS

## 2015-07-30 MED ORDER — FOLIC ACID 5 MG/ML IJ SOLN
5 mg/mL | Freq: Once | INTRAMUSCULAR | Status: AC
Start: 2015-07-30 — End: 2015-07-31
  Administered 2015-07-30: 15:00:00 via INTRAVENOUS

## 2015-07-30 MED ORDER — LORAZEPAM 1 MG TAB
1 mg | Freq: Four times a day (QID) | ORAL | Status: DC
Start: 2015-07-30 — End: 2015-08-01
  Administered 2015-07-31 – 2015-08-01 (×3): via ORAL

## 2015-07-30 MED ORDER — THIAMINE HCL 100 MG TAB
100 mg | Freq: Every day | ORAL | Status: DC
Start: 2015-07-30 — End: 2015-08-01
  Administered 2015-07-31 – 2015-08-01 (×2): via ORAL

## 2015-07-30 MED ORDER — THERAPEUTIC MULTIVITAMIN TAB
Freq: Every day | ORAL | Status: DC
Start: 2015-07-30 — End: 2015-07-31

## 2015-07-30 MED ORDER — LORAZEPAM 1 MG TAB
1 mg | ORAL | Status: DC | PRN
Start: 2015-07-30 — End: 2015-08-01

## 2015-07-30 MED ORDER — LORAZEPAM 1 MG TAB
1 mg | ORAL | Status: AC
Start: 2015-07-30 — End: 2015-07-30

## 2015-07-30 MED ORDER — SODIUM CHLORIDE 0.9 % IJ SYRG
INTRAMUSCULAR | Status: DC | PRN
Start: 2015-07-30 — End: 2015-08-01

## 2015-07-30 MED ORDER — SODIUM CHLORIDE 0.9 % IJ SYRG
Freq: Three times a day (TID) | INTRAMUSCULAR | Status: DC
Start: 2015-07-30 — End: 2015-08-01
  Administered 2015-08-01: 10:00:00 via INTRAVENOUS

## 2015-07-30 MED ORDER — LORAZEPAM 1 MG TAB
1 mg | ORAL | Status: DC | PRN
Start: 2015-07-30 — End: 2015-07-30

## 2015-07-30 MED ORDER — FOLIC ACID 1 MG TAB
1 mg | Freq: Every day | ORAL | Status: DC
Start: 2015-07-30 — End: 2015-08-01
  Administered 2015-07-31 – 2015-08-01 (×2): via ORAL

## 2015-07-30 MED ORDER — THIAMINE HCL 100 MG TAB
100 mg | Freq: Every day | ORAL | Status: DC
Start: 2015-07-30 — End: 2015-07-30

## 2015-07-30 MED FILL — THIAMINE HCL 100 MG TAB: 100 mg | ORAL | Qty: 1

## 2015-07-30 MED FILL — LORAZEPAM 2 MG/ML IJ SOLN: 2 mg/mL | INTRAMUSCULAR | Qty: 1

## 2015-07-30 MED FILL — SODIUM CHLORIDE 0.9 % IV: INTRAVENOUS | Qty: 1000

## 2015-07-30 MED FILL — FOLIC ACID 1 MG TAB: 1 mg | ORAL | Qty: 1

## 2015-07-30 NOTE — ED Notes (Signed)
Patient resting quietly in bed. No distress noted. Respirations even and unlabored.

## 2015-07-30 NOTE — ED Notes (Signed)
Sitter with pt

## 2015-07-30 NOTE — ED Notes (Signed)
Report to [employer]

## 2015-07-30 NOTE — ED Notes (Signed)
Patient resting quietly in bed. No distress noted. Respirations even and unlabored. Patient given water and warm blankets

## 2015-07-30 NOTE — ED Notes (Signed)
Pt resting... Sitter with pt

## 2015-07-30 NOTE — ED Notes (Signed)
Sitter with pt... Pt in paper blue scrubs

## 2015-07-30 NOTE — ED Provider Notes (Signed)
HPI Comments: 34 year old male patient presents to emergency room after reportedly Seroquel ingestion this morning in an attempt to kill himself.  States he's been feeling depressed lately and took approximately 20, 200 milligram Seroquel tablets at approximately 4:00 this morning.  He states he was discovered on the kitchen floor by his roommate was able to arouse the patient and then call his girlfriend.  He states his girlfriend talked him into coming in for evaluation of his suicidal ideations. Patient states that his roommate expressed concern for possible Tylenol ingestion as well as there was an empty bottle of Tylenol and Tylenol PM next to the ingested bottle Seroquel.  She was unsure if he took Tylenol. Patient voices continued suicidal ideations at this time and expresses remorse that he was "unable to finish the job".  Patient describes recent disagreements with an exand a custody battle for his children made him feel the way does currently. Edition the patient was a daily drinker for several years stopped using alcohol approximately 24 hours ago.  Patient voices auditory hallucinations at this time but denies visual hallucinations.  Denies previous episode of DTs or alcohol withdrawal in the past cc never tried harm himself in the past.  The patient reports daily use of tobacco 2-3 packs per day.  In addition he reports using cocaine laced marijuana cigarettes, most recently last week. Patient denies any history of psychiatric disorder, previous psychiatric admission for suicidal ideation/attempt in the past.    Patient is a 34 y.o. male presenting with suicidal ideation. The history is provided by the patient. No language interpreter was used.   Suicidal   This is a new problem. The current episode started 12 to 24 hours ago. The problem has not changed since onset.Pertinent negatives include no loss of sensation, no slurred speech, no speech difficulty, no agitation, no  visual change, no mental status change, no unresponsiveness and no disorientation. There has been no fever. Pertinent negatives include no shortness of breath, no chest pain, no vomiting, no altered mental status, no confusion, no headaches, no choking, no nausea, no bowel incontinence and no bladder incontinence.        Past Medical History:   Diagnosis Date   ??? Cancer (HCC)      testiclar cancer   ??? Ill-defined condition      restless legs       Past Surgical History:   Procedure Laterality Date   ??? Hx other surgical       kidney stones removal.   ??? Hx urological       left testicle removal.         No family history on file.    Social History     Social History   ??? Marital status: SINGLE     Spouse name: N/A   ??? Number of children: N/A   ??? Years of education: N/A     Occupational History   ??? Not on file.     Social History Main Topics   ??? Smoking status: Current Every Day Smoker     Packs/day: 1.00   ??? Smokeless tobacco: Not on file   ??? Alcohol use No   ??? Drug use: Not on file   ??? Sexual activity: Not on file     Other Topics Concern   ??? Not on file     Social History Narrative         ALLERGIES: Tramadol    Review of Systems   Constitutional:  Negative for chills, diaphoresis and fever.   HENT: Negative for congestion, sneezing and sore throat.    Eyes: Negative for visual disturbance.   Respiratory: Negative for cough, choking, chest tightness, shortness of breath and wheezing.    Cardiovascular: Negative for chest pain and leg swelling.   Gastrointestinal: Negative for abdominal pain, blood in stool, bowel incontinence, diarrhea, nausea and vomiting.   Endocrine: Negative for polyuria.   Genitourinary: Negative for bladder incontinence, difficulty urinating, dysuria, flank pain, hematuria and urgency.   Musculoskeletal: Negative for back pain, myalgias, neck pain and neck stiffness.   Skin: Negative for color change and rash.   Neurological: Negative for dizziness, syncope, speech difficulty,  weakness, light-headedness, numbness and headaches.   Psychiatric/Behavioral: Positive for hallucinations, self-injury and suicidal ideas. Negative for agitation, behavioral problems and confusion. The patient is nervous/anxious.         Reports depression.   All other systems reviewed and are negative.      There were no vitals filed for this visit.         Physical Exam   Constitutional: He is oriented to person, place, and time. He appears well-developed and well-nourished. No distress.   Sleeping prior to evaluation.  No acute distress noted.  Appears anxious at times during exam.   HENT:   Head: Normocephalic and atraumatic.   Eyes: Conjunctivae and EOM are normal. Pupils are equal, round, and reactive to light.   Neck: No JVD present. No tracheal deviation present.   Cardiovascular: Normal rate, regular rhythm, normal heart sounds and intact distal pulses.  Exam reveals no gallop and no friction rub.    No murmur heard.  Pulmonary/Chest: Effort normal and breath sounds normal. No stridor. No respiratory distress. He has no wheezes. He has no rales. He exhibits no tenderness.   Abdominal: Soft. He exhibits no distension and no mass. There is no tenderness. There is no rebound and no guarding.   Musculoskeletal: Normal range of motion. He exhibits no edema, tenderness or deformity.   Neurological: He is alert and oriented to person, place, and time. No cranial nerve deficit.   Skin: Skin is warm and dry. No rash noted. He is not diaphoretic.   Psychiatric: His behavior is normal. Thought content normal. His mood appears anxious. His speech is not delayed, not tangential and not slurred. Cognition and memory are normal. He exhibits a depressed mood. He is communicative.   Nursing note and vitals reviewed.       MDM  Number of Diagnoses or Management Options  Suicidal ideations: new and requires workup  Suicide attempt by drug ingestion, initial encounter [facility](HCC): new and requires workup   Diagnosis management comments: Spoke with poison who recommend 6 hour observation, state peak onset of symptoms should occur between 2 and 4 hours of ingestion.  Recommend EKG to monitor for QTC prolongation.    Spoke with on call psychiatry who agrees to evaluate the patient via tele-psych.    Patient will require involuntary commitment given his reported suicide attempt and continued suicidal ideations.    On-call psychiatry recommends patient admission to psychiatric facility and requested initiation of CIWA protocol for alcohol withdrawal at this time. Patient currently displays no evidence of withdrawal but does admit to auditory hallucinations.  These orders have been placed in the patient's chart and documentation for involuntary hold has been completed.  Patient will also receive thiamine and folate supplementation at this time.      Tylenol Level normal at  this time.        Amount and/or Complexity of Data Reviewed  Clinical lab tests: ordered and reviewed  Tests in the medicine section of CPT??: ordered and reviewed    Risk of Complications, Morbidity, and/or Mortality  Presenting problems: moderate  Diagnostic procedures: moderate  Management options: moderate    Patient Progress  Patient progress: improved    ED Course   Value Comment By Time   HGB: (!) 11.8 (Reviewed) Lacie Draft, DO 08/25 1040       Procedures

## 2015-07-30 NOTE — ED Triage Notes (Signed)
Patient states intentional overdose on his seroquel. Took over 20 pills last night attempting to kill himself.

## 2015-07-30 NOTE — Progress Notes (Signed)
Patient on commitment paperwork.  Patient placed on list at [facility] (r/t [facility] address).  Faxed paperwork per request.

## 2015-07-31 MED ORDER — MULTIVITAMIN-IRON 9 MG-FOLIC ACID 400 MCG-CALCIUM & MINERALS TABLET
9 mg iron-400 mcg | Freq: Every day | ORAL | Status: DC
Start: 2015-07-31 — End: 2015-08-01
  Administered 2015-07-31 – 2015-08-01 (×2): via ORAL

## 2015-07-31 MED FILL — VITAMIN B-1 100 MG TABLET: 100 mg | ORAL | Qty: 1

## 2015-07-31 MED FILL — FOLIC ACID 1 MG TAB: 1 mg | ORAL | Qty: 1

## 2015-07-31 MED FILL — ONCOVITE TABLET: ORAL | Qty: 1

## 2015-07-31 MED FILL — LORAZEPAM 1 MG TAB: 1 mg | ORAL | Qty: 1

## 2015-07-31 MED FILL — THERA M PLUS (FERROUS FUMARATE) 9 MG IRON-400 MCG TABLET: 9 mg iron-400 mcg | ORAL | Qty: 1

## 2015-07-31 NOTE — ED Notes (Signed)
Patient resting quietly in bed. No distress noted. Respirations even and unlabored.

## 2015-07-31 NOTE — Progress Notes (Signed)
Patient updated on status and process.  Patient upset that there is not a television in the room and no magazines.  Patient requesting a Bible to read.  Ok per charge for patient to have Bible in room.  Bible taken to patient.  Sitter aware.

## 2015-07-31 NOTE — ED Notes (Signed)
Pt resting in bed with eyes closed. Respirations even and nonlabored. Sitter at door. Will continue to monitor.

## 2015-07-31 NOTE — ED Notes (Signed)
Resting quietly with eyes closed.  Sitter with patient at all times.

## 2015-07-31 NOTE — ED Notes (Signed)
Patient report to [place], [place]

## 2015-07-31 NOTE — ED Notes (Signed)
Patient verbalized wanting to go home. MD in to speak with patient, verbalized to MD that he is unsure about going home.

## 2015-07-31 NOTE — ED Notes (Signed)
Pt resting in bed. Sitter at door

## 2015-07-31 NOTE — ED Notes (Signed)
Patient resting in bed. Sitter at door. Pt given meal tray

## 2015-07-31 NOTE — ED Notes (Signed)
Resting quietly with eyes closed.  Sitter with patient at all times.

## 2015-07-31 NOTE — ED Notes (Signed)
Pt resting supine with eyes closed. Respirations even and nonlabored. Sitter remains at door. Will continue to monitor.

## 2015-07-31 NOTE — ED Notes (Signed)
Continues to rest quietly with eyes closed.  No distress noted.  Sitter with patient at all times.

## 2015-07-31 NOTE — ED Notes (Signed)
Patient given meal tray.

## 2015-07-31 NOTE — ED Notes (Signed)
Report received from Lorrie RN

## 2015-07-31 NOTE — ED Notes (Signed)
Report received from katie rn

## 2015-07-31 NOTE — ED Notes (Signed)
Pt to shower with security and sitter

## 2015-08-01 MED ORDER — CHLORDIAZEPOXIDE 10 MG CAP
10 mg | ORAL_CAPSULE | Freq: Two times a day (BID) | ORAL | 0 refills | Status: AC
Start: 2015-08-01 — End: 2015-08-06

## 2015-08-01 MED FILL — LORAZEPAM 1 MG TAB: 1 mg | ORAL | Qty: 1

## 2015-08-01 MED FILL — VITAMIN B-1 100 MG TABLET: 100 mg | ORAL | Qty: 1

## 2015-08-01 MED FILL — FOLIC ACID 1 MG TAB: 1 mg | ORAL | Qty: 1

## 2015-08-01 MED FILL — THERA M PLUS (FERROUS FUMARATE) 9 MG IRON-400 MCG TABLET: 9 mg iron-400 mcg | ORAL | Qty: 1

## 2015-08-01 NOTE — ED Notes (Signed)
Crackers peanut butter and ginger ale provided per pt request. Denies other complaints at this time. Pt calm and cooperative.  [employer] [employer]

## 2015-08-01 NOTE — ED Notes (Signed)
Report to brooke rn

## 2015-08-01 NOTE — ED Notes (Signed)
I have reviewed discharge instructions with the patient.  The patient verbalized understanding. Prescription and dc papers given and explained to pt. Pt denies any further needs, questions, or concerns at this time. No adverse reactions to any medications, treatments, or procedures noted. Pt belongings returned to him. Pt ambulatory with steady gait to lobby.

## 2015-08-01 NOTE — ED Notes (Signed)
Patient education was documented under the  Education tab

## 2015-08-01 NOTE — ED Notes (Signed)
Verbal report received from PearlingtonMegan, RN on [facility] [place] for routine progression of care .Report consisted of patient???s Situation, Background, Assessment and Recommendations(SBAR). Opportunity for questions and clarification was provided.  Care assumed. Patient is resting in the stretcher. Sitter at bedside. Patient denies any needs at this time. Call light within reach.

## 2015-08-01 NOTE — ED Notes (Signed)
Pt resting with eyes closed. Respirations even and nonlabored. Will continue to monitor. Sitter at door.

## 2015-08-01 NOTE — ED Notes (Signed)
Pt resting with eyes closed. Respirations even and nonlabored. Sitter at doorway. Will continue to monitor.

## 2015-08-01 NOTE — ED Notes (Signed)
Pt resting on left side. Head covered with blanket. Respirations even and nonlabored. Sitter at door.

## 2015-08-21 DIAGNOSIS — N508 Other specified disorders of male genital organs: Secondary | ICD-10-CM

## 2015-08-21 NOTE — ED Triage Notes (Signed)
Patient states right testicle pain, states started about 1.5 hours ago. patient states swelling to left side.

## 2015-08-22 ENCOUNTER — Inpatient Hospital Stay
Admit: 2015-08-22 | Discharge: 2015-08-22 | Disposition: A | Discharge status: Home / Self Care | Payer: Self-pay | Attending: Emergency Medicine

## 2015-08-22 ENCOUNTER — Emergency Department: Admit: 2015-08-22 | Payer: Self-pay

## 2015-08-22 LAB — URINE MICROSCOPIC
Bacteria: 0 /hpf
Casts: 0 /lpf
RBC: >100 /hpf — ABNORMAL HIGH

## 2015-08-22 MED ORDER — HYDROCODONE-ACETAMINOPHEN 7.5 MG-325 MG TAB
ORAL_TABLET | Freq: Four times a day (QID) | ORAL | 0 refills | Status: DC | PRN
Start: 2015-08-22 — End: 2015-10-15

## 2015-08-22 MED ORDER — HYDROCODONE-ACETAMINOPHEN 7.5 MG-325 MG TAB
ORAL | Status: AC
Start: 2015-08-22 — End: 2015-08-22
  Administered 2015-08-22: 07:00:00 via ORAL

## 2015-08-22 MED ORDER — TRIMETHOPRIM-SULFAMETHOXAZOLE 160 MG-800 MG TAB
160-800 mg | ORAL_TABLET | Freq: Two times a day (BID) | ORAL | 0 refills | Status: AC
Start: 2015-08-22 — End: 2015-08-29

## 2015-08-22 MED FILL — HYDROCODONE-ACETAMINOPHEN 7.5 MG-325 MG TAB: ORAL | Qty: 1

## 2015-08-22 NOTE — ED Provider Notes (Signed)
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  He was fine today and was doing his regular construction job and about an hour and a half before coming in [place] from work had onset of right testicular pain.  Denies any flank pain.  Denies any fevers chills.  He has not noticed any hematuria.  He denies any trauma or injury.  He has no STD symptoms such as urethral discharge.   He has no pain with which he has been restless or pacing.she states he's also had kidney stones in the past and this does not feel like that.    Patient is a 34 y.o. male presenting with testicular pain. The history is provided by the patient.   Testicle Pain   This is a new problem. The current episode started 1 to 2 hours ago. The problem occurs constantly. The problem has been gradually improving. Primary symptoms include scrotal pain.Pertinent negatives include no penile discharge, no penile pain, no testicular mass and no swelling. Pertinent negatives include no diaphoresis, no abdominal pain, no abdominal swelling, no frequency and no flank pain. There has been no fever.  He has tried nothing for the symptoms.  Partner displays symptoms of an STD: no. Associated medical issues do not include erectile dysfunction. Associated medical issues comments: left testicular cancer with orchiectomy.        Past Medical History:   Diagnosis Date   ??? Cancer (HCC)      testiclar cancer   ??? Ill-defined condition      restless legs       Past Surgical History:   Procedure Laterality Date   ??? Hx other surgical       kidney stones removal.   ??? Hx urological       left testicle removal.         No family history on file.    Social History     Social History   ??? Marital status: SINGLE     Spouse name: N/A   ??? Number of children: N/A   ??? Years of education: N/A     Occupational History   ??? Not on file.     Social History Main Topics   ??? Smoking status: Current Every Day Smoker     Packs/day: 0.50    ??? Smokeless tobacco: Not on file   ??? Alcohol use No   ??? Drug use: Not on file   ??? Sexual activity: Not on file     Other Topics Concern   ??? Not on file     Social History Narrative         ALLERGIES: Tramadol    Review of Systems   Constitutional: Negative for diaphoresis and fever.   HENT: Negative.    Respiratory: Negative.    Cardiovascular: Negative.    Gastrointestinal: Negative for abdominal pain.   Genitourinary: Positive for testicular pain. Negative for flank pain, frequency, penile discharge and penile pain.   All other systems reviewed and are negative.      Vitals:    08/21/15 2325   BP: (!) 154/98   Pulse: (!) 103   Resp: 18   Temp: 98.4 ??F (36.9 ??C)   SpO2: 99%   Weight: 86.2 kg (190 lb)   Height:  (1.778 m)            Physical Exam   Constitutional: He appears well-developed and well-nourished. No distress.   HENT:  Head: Atraumatic.   Eyes: No scleral icterus.   Neck: Neck supple.   Cardiovascular: Normal rate.    Pulmonary/Chest: Effort normal. No respiratory distress.   Abdominal: Soft. There is no tenderness. There is no rebound.   Genitourinary: No swelling in the scrotum or testes. Right testis shows no mass and no swelling. Circumcised. No penile tenderness. No discharge found.   Genitourinary Comments: Left testicle is surgically absent.  Right testicle is in a normal position with slight  epididymal discomfort but only minimal actually   Neurological: He is alert.   Skin: Skin is warm and dry.   Psychiatric: His behavior is normal. Thought content normal.   Nursing note and vitals reviewed.       MDM  Number of Diagnoses or Management Options  Hematuria:   Pain in right testicle:   Diagnosis management comments: Patient with some right testicular pain with essentially normal ultrasound minimal discomfort to the epididymis.  No evidence of an STD.  He does have hematuria and kidney stone symptoms or further re-reviewed with patient and patient does not really have any  flank pain/restlessness .  He is not really have infectious symptoms as a cause for his hematuria after discussion of options patient prefers to get referred to urology for ongoing workup and she will return with significant worsening       Amount and/or Complexity of Data Reviewed  Clinical lab tests: ordered and reviewed  Tests in the radiology section of CPT??: reviewed and ordered    Risk of Complications, Morbidity, and/or Mortality  Presenting problems: moderate  Diagnostic procedures: low  Management options: moderate  General comments: Where he will need further workup with ongoing pain or problems,, we will be referring him to urology for workup of his hematuria and right testicular pain which is this is solo remaining testicle    Patient Progress  Patient progress: stable    ED Course       Procedures    Recent Results (from the past 12 hour(s))   URINE MICROSCOPIC    Collection Time: 08/22/15 12:16 AM   Result Value Ref Range    WBC 0-3 0 /hpf    RBC >100 (H) 0 /hpf    Epithelial cells 0-3 0 /hpf    Bacteria 0 0 /hpf    Casts 0 0 /lpf        US SCROTUM/TESTICLES (Final result) Result time: 08/22/15 00:51:45   ?? Final result by Harrel Carina, MD (08/22/15 00:51:45)   ?? Impression:   ?? IMPRESSION: ??Normal right testicle.    Status post left orchiectomy.      ?? Narrative:   ?? EXAM: ??US SCROTUM/TESTICLES    INDICATION: ?? right testicle pain    COMPARISON: None.    TECHNIQUE:  Real-time sonography of the scrotum was performed with a high frequency linear  transducer. Multiple static images were obtained. Color Doppler evaluation was  also performed.    FINDINGS:  Status post left orchiectomy. The right testicle is normal in size and  echotexture with normal color-flow. The right testis measures 3.6 x 1.9 x 2.4  cm. The right epididymis is normal as well. It contains a incidental 8 mm cyst.  There is no evidence of a hydrocele or varicocele.     ??

## 2015-08-22 NOTE — ED Notes (Signed)
Pt's boss is driving him home.

## 2015-08-22 NOTE — ED Notes (Signed)
I have reviewed discharge instructions with the patient.  The patient verbalized understanding. Discharge medications reviewed with patient and appropriate educational materials and side effects teaching were provided. Pt denies any further needs, questions or concerns. Pt ambulatory to the lobby for discharge.

## 2015-10-12 ENCOUNTER — Emergency Department: Admit: 2015-10-12 | Payer: Self-pay

## 2015-10-12 ENCOUNTER — Inpatient Hospital Stay
Admit: 2015-10-12 | Discharge: 2015-10-12 | Disposition: A | Discharge status: Home / Self Care | Payer: Self-pay | Attending: Emergency Medicine

## 2015-10-12 DIAGNOSIS — R109 Unspecified abdominal pain: Secondary | ICD-10-CM

## 2015-10-12 LAB — METABOLIC PANEL, COMPREHENSIVE
A-G Ratio: 1.3 (ref 1.2–3.5)
ALT (SGPT): 22 U/L (ref 12–65)
AST (SGOT): 20 U/L (ref 15–37)
Albumin: 4.1 g/dL (ref 3.5–5.0)
Alk. phosphatase: 103 U/L (ref 50–136)
Anion gap: 11 mmol/L (ref 7–16)
BUN: 14 MG/DL (ref 6–23)
Bilirubin, total: 0.4 MG/DL (ref 0.2–1.1)
CO2: 25 mmol/L (ref 21–32)
Calcium: 8.9 MG/DL (ref 8.3–10.4)
Chloride: 106 mmol/L (ref 98–107)
Creatinine: 1.31 MG/DL (ref 0.8–1.5)
GFR est AA: >60 mL/min/{1.73_m2} (ref >60)
GFR est non-AA: >60 mL/min/{1.73_m2} (ref >60)
Globulin: 3.2 g/dL (ref 2.3–3.5)
Glucose: 117 mg/dL — ABNORMAL HIGH (ref 65–100)
Potassium: 3.9 mmol/L (ref 3.5–5.1)
Protein, total: 7.3 g/dL (ref 6.3–8.2)
Sodium: 142 mmol/L (ref 136–145)

## 2015-10-12 LAB — CBC WITH AUTOMATED DIFF
ABS. BASOPHILS: 0 10*3/uL (ref 0.0–0.2)
ABS. EOSINOPHILS: 0 10*3/uL (ref 0.0–0.8)
ABS. IMM. GRANS.: 0 10*3/uL (ref 0.0–0.5)
ABS. LYMPHOCYTES: 1.1 10*3/uL (ref 0.5–4.6)
ABS. MONOCYTES: 0.9 10*3/uL (ref 0.1–1.3)
ABS. NEUTROPHILS: 12.8 10*3/uL — ABNORMAL HIGH (ref 1.7–8.2)
BASOPHILS: 0 % (ref 0.0–2.0)
EOSINOPHILS: 0 % — ABNORMAL LOW (ref 0.5–7.8)
HCT: 40.8 % — ABNORMAL LOW (ref 41.1–50.3)
HGB: 13.3 g/dL — ABNORMAL LOW (ref 13.6–17.2)
IMMATURE GRANULOCYTES: 0.1 % (ref 0.0–5.0)
LYMPHOCYTES: 8 % — ABNORMAL LOW (ref 13–44)
MCH: 27.5 PG (ref 26.1–32.9)
MCHC: 32.6 g/dL (ref 31.4–35.0)
MCV: 84.3 FL (ref 79.6–97.8)
MONOCYTES: 6 % (ref 4.0–12.0)
MPV: 11.9 FL (ref 10.8–14.1)
NEUTROPHILS: 86 % — ABNORMAL HIGH (ref 43–78)
PLATELET: 290 10*3/uL (ref 150–450)
RBC: 4.84 M/uL (ref 4.23–5.67)
RDW: 16.7 % — ABNORMAL HIGH (ref 11.9–14.6)
WBC: 14.9 10*3/uL — ABNORMAL HIGH (ref 4.3–11.1)

## 2015-10-12 LAB — URINE MICROSCOPIC
Bacteria: 0 /hpf
Casts: 0 /lpf
RBC: >100 /hpf — ABNORMAL HIGH

## 2015-10-12 MED ORDER — MORPHINE 4 MG/ML SYRINGE
4 mg/mL | INTRAMUSCULAR | Status: AC
Start: 2015-10-12 — End: 2015-10-12
  Administered 2015-10-12: 22:00:00 via INTRAVENOUS

## 2015-10-12 MED ORDER — OXYCODONE 5 MG TAB
5 mg | ORAL_TABLET | ORAL | 0 refills | Status: DC | PRN
Start: 2015-10-12 — End: 2015-10-15

## 2015-10-12 MED ORDER — TAMSULOSIN SR 0.4 MG 24 HR CAP
0.4 mg | ORAL_CAPSULE | Freq: Every day | ORAL | 0 refills | Status: AC
Start: 2015-10-12 — End: 2015-10-27

## 2015-10-12 MED ORDER — ONDANSETRON (PF) 4 MG/2 ML INJECTION
4 mg/2 mL | INTRAMUSCULAR | Status: AC
Start: 2015-10-12 — End: 2015-10-12
  Administered 2015-10-12: 22:00:00 via INTRAVENOUS

## 2015-10-12 MED ORDER — PROMETHAZINE 25 MG TAB
25 mg | ORAL_TABLET | Freq: Four times a day (QID) | ORAL | 0 refills | Status: AC | PRN
Start: 2015-10-12 — End: ?

## 2015-10-12 MED ORDER — HYDROMORPHONE (PF) 1 MG/ML IJ SOLN
1 mg/mL | INTRAMUSCULAR | Status: AC
Start: 2015-10-12 — End: 2015-10-12
  Administered 2015-10-12: 23:00:00 via INTRAVENOUS

## 2015-10-12 MED ORDER — KETOROLAC TROMETHAMINE 30 MG/ML INJECTION
30 mg/mL (1 mL) | INTRAMUSCULAR | Status: AC
Start: 2015-10-12 — End: 2015-10-12
  Administered 2015-10-12: 22:00:00 via INTRAVENOUS

## 2015-10-12 MED FILL — KETOROLAC TROMETHAMINE 30 MG/ML INJECTION: 30 mg/mL (1 mL) | INTRAMUSCULAR | Qty: 1

## 2015-10-12 MED FILL — HYDROMORPHONE (PF) 1 MG/ML IJ SOLN: 1 mg/mL | INTRAMUSCULAR | Qty: 1

## 2015-10-12 MED FILL — ONDANSETRON (PF) 4 MG/2 ML INJECTION: 4 mg/2 mL | INTRAMUSCULAR | Qty: 2

## 2015-10-12 MED FILL — MORPHINE 4 MG/ML SYRINGE: 4 mg/mL | INTRAMUSCULAR | Qty: 1

## 2015-10-12 NOTE — ED Triage Notes (Signed)
Reports constant right flank pain x 2 hours.  States history of kidney stones but his feels different.

## 2015-10-12 NOTE — ED Notes (Signed)
Report given to [place]mie, Charity fundraiserN. Patient to CT.

## 2015-10-12 NOTE — ED Notes (Signed)
I have reviewed discharge instructions with the patient.  The patient verbalized understanding. Ambulatory to exit with steady gait. Discharged with girlfriend.

## 2015-10-12 NOTE — ED Provider Notes (Addendum)
HPI Comments: 34 year old male with 5 hours of right flank pain radiating to right groin.  Some hematuria and vomiting.  History of kidney stones but this feels different.  No rash no fever or trauma    Patient is a 34 y.o. male presenting with flank pain. The history is provided by the patient.   Flank Pain    This is a new problem. The problem has not changed since onset.The problem occurs constantly. The pain is associated with no known injury. The pain is present in the lower back. The pain radiates to the right groin. The pain is moderate. Associated symptoms include abdominal pain. Pertinent negatives include no chest pain, no fever, no headaches, no dysuria and no paresthesias. Risk factors include history of kidney stones.        Past Medical History:   Diagnosis Date   ??? Cancer ([place])      testiclar cancer   ??? Ill-defined condition      restless legs       Past Surgical History:   Procedure Laterality Date   ??? Hx other surgical       kidney stones removal.   ??? Hx urological       left testicle removal.         History reviewed. No pertinent family history.    Social History     Social History   ??? Marital status: SINGLE     Spouse name: N/A   ??? Number of children: N/A   ??? Years of education: N/A     Occupational History   ??? Not on file.     Social History Main Topics   ??? Smoking status: Current Every Day Smoker     Packs/day: 0.50   ??? Smokeless tobacco: Not on file   ??? Alcohol use No   ??? Drug use: Not on file   ??? Sexual activity: Not on file     Other Topics Concern   ??? Not on file     Social History Narrative         ALLERGIES: Tramadol    Review of Systems   Constitutional: Negative for chills and fever.   Respiratory: Negative for cough and shortness of breath.    Cardiovascular: Negative for chest pain and palpitations.   Gastrointestinal: Positive for abdominal pain, nausea and vomiting. Negative for diarrhea.   Genitourinary: Positive for flank pain. Negative for dysuria.    Musculoskeletal: Negative for back pain and neck pain.   Skin: Negative for color change and rash.   Neurological: Negative for syncope, headaches and paresthesias.       Vitals:    10/12/15 1427 10/12/15 1651 10/12/15 1652   BP: 122/80 136/81    Pulse: 78     Resp: 18     Temp: 97.9 ??F (36.6 ??C)     SpO2: 99%  98%   Weight: 93 kg (205 lb)     Height: 5' 10" (1.778 m)              Physical Exam   Constitutional: He is oriented to person, place, and time. He appears well-developed and well-nourished. No distress.   HENT:   Head: Normocephalic and atraumatic.   Mouth/Throat: Oropharynx is clear and moist. No oropharyngeal exudate.   Eyes: Conjunctivae and EOM are normal. Pupils are equal, round, and reactive to light.   Neck: Normal range of motion. Neck supple.   Cardiovascular:   No murmur heard.  Pulmonary/Chest: Breath  sounds normal. No respiratory distress.   Abdominal: Soft. Bowel sounds are normal. He exhibits no mass. There is tenderness in the right lower quadrant. There is CVA tenderness and tenderness at McBurney's point. There is no rebound, no guarding and negative Murphy's sign. No hernia.   Neurological: He is alert and oriented to person, place, and time. Gait normal.   Nl speech   Skin: Skin is warm and dry.   Psychiatric: He has a normal mood and affect. His speech is normal.   Nursing note and vitals reviewed.       MDM  Number of Diagnoses or Management Options  Diagnosis management comments: Assess CBC and urine.  Concern for appendicitis given anterior tenderness.       Amount and/or Complexity of Data Reviewed  Clinical lab tests: reviewed  Tests in the radiology section of CPT??: reviewed  Independent visualization of images, tracings, or specimens: yes    Risk of Complications, Morbidity, and/or Mortality  Presenting problems: moderate  Diagnostic procedures: low  Management options: moderate    Patient Progress  Patient progress: stable    ED Course       Procedures    Results Include:     Recent Results (from the past 24 hour(s))   CBC WITH AUTOMATED DIFF    Collection Time: 10/12/15  2:32 PM   Result Value Ref Range    WBC 14.9 (H) 4.3 - 11.1 K/uL    RBC 4.84 4.23 - 5.67 M/uL    HGB 13.3 (L) 13.6 - 17.2 g/dL    HCT 40.8 (L) 41.1 - 50.3 %    MCV 84.3 79.6 - 97.8 FL    MCH 27.5 26.1 - 32.9 PG    MCHC 32.6 31.4 - 35.0 g/dL    RDW 16.7 (H) 11.9 - 14.6 %    PLATELET 290 150 - 450 K/uL    MPV 11.9 10.8 - 14.1 FL    DF AUTOMATED      NEUTROPHILS 86 (H) 43 - 78 %    LYMPHOCYTES 8 (L) 13 - 44 %    MONOCYTES 6 4.0 - 12.0 %    EOSINOPHILS 0 (L) 0.5 - 7.8 %    BASOPHILS 0 0.0 - 2.0 %    IMMATURE GRANULOCYTES 0.1 0.0 - 5.0 %    ABS. NEUTROPHILS 12.8 (H) 1.7 - 8.2 K/UL    ABS. LYMPHOCYTES 1.1 0.5 - 4.6 K/UL    ABS. MONOCYTES 0.9 0.1 - 1.3 K/UL    ABS. EOSINOPHILS 0.0 0.0 - 0.8 K/UL    ABS. BASOPHILS 0.0 0.0 - 0.2 K/UL    ABS. IMM. GRANS. 0.0 0.0 - 0.5 K/UL   METABOLIC PANEL, COMPREHENSIVE    Collection Time: 10/12/15  2:32 PM   Result Value Ref Range    Sodium 142 136 - 145 mmol/L    Potassium 3.9 3.5 - 5.1 mmol/L    Chloride 106 98 - 107 mmol/L    CO2 25 21 - 32 mmol/L    Anion gap 11 7 - 16 mmol/L    Glucose 117 (H) 65 - 100 mg/dL    BUN 14 6 - 23 MG/DL    Creatinine 1.31 0.8 - 1.5 MG/DL    GFR est AA >60 >60 ml/min/1.104m    GFR est non-AA >60 >60 ml/min/1.729m   Calcium 8.9 8.3 - 10.4 MG/DL    Bilirubin, total 0.4 0.2 - 1.1 MG/DL    ALT 22 12 - 65 U/L  AST 20 15 - 37 U/L    Alk. phosphatase 103 50 - 136 U/L    Protein, total 7.3 6.3 - 8.2 g/dL    Albumin 4.1 3.5 - 5.0 g/dL    Globulin 3.2 2.3 - 3.5 g/dL    A-G Ratio 1.3 1.2 - 3.5     URINE MICROSCOPIC    Collection Time: 10/12/15  5:00 PM   Result Value Ref Range    WBC 5-10 0 /hpf    RBC >100 (H) 0 /hpf    Epithelial cells 0-3 0 /hpf    Bacteria 0 0 /hpf    Casts 0 0 /lpf     Ct Urogram Wo Cont    Result Date: 10/12/2015  HISTORY: Right flank pain, 2 hours duration. History of renal calculi.  Exam: CT urogram Technique: Thin section axial CT images are obtained from the lung bases to the cyst. Radiation dose reduction techniques were used for this study.  Our CT scanners use one or all of the following: Automated exposure control, adjustment of the mA and/or kV according to patient size, use of iterative reconstruction. No comparison. FINDINGS: The lung bases are clear. CT abdomen:  No contour deforming abnormality involving the liver and spleen, though these are incompletely evaluated. The gallbladder and pancreas are normal. The adrenal glands are unremarkable. Limited evaluation the bowel loops in the upper abdomen is unremarkable. There is no definite upper abdominal lymphadenopathy. Just distal to the right UPJ, there is an obstructing 5 mm calculus (image 45). There is moderate right-sided hydroureteronephrosis with some perinephric fat stranding. No left-sided renal or ureteral calculi. Distal to the calculus, the right ureter is decompressed. CT pelvis: The appendix is normal. There is no pelvic adenopathy. There is no free fluid or free air present within the pelvis. Bone window evaluation demonstrates no aggressive osseous lesions.     IMPRESSION: 5 mm obstructing calculus on the right, just distal to the right UPJ.       We'll provide pain and nausea control.  Referred to urology for follow-up.

## 2015-10-15 ENCOUNTER — Inpatient Hospital Stay
Admit: 2015-10-15 | Discharge: 2015-10-15 | Disposition: A | Discharge status: Home / Self Care | Payer: Self-pay | Attending: Emergency Medicine

## 2015-10-15 DIAGNOSIS — N200 Calculus of kidney: Secondary | ICD-10-CM

## 2015-10-15 LAB — CBC WITH AUTOMATED DIFF
ABS. BASOPHILS: 0 10*3/uL (ref 0.0–0.2)
ABS. EOSINOPHILS: 0.1 10*3/uL (ref 0.0–0.8)
ABS. IMM. GRANS.: 0 10*3/uL (ref 0.0–0.5)
ABS. LYMPHOCYTES: 1.5 10*3/uL (ref 0.5–4.6)
ABS. MONOCYTES: 0.4 10*3/uL (ref 0.1–1.3)
ABS. NEUTROPHILS: 6.3 10*3/uL (ref 1.7–8.2)
BASOPHILS: 0 % (ref 0.0–2.0)
EOSINOPHILS: 2 % (ref 0.5–7.8)
HCT: 39.2 % — ABNORMAL LOW (ref 41.1–50.3)
HGB: 12.4 g/dL — ABNORMAL LOW (ref 13.6–17.2)
IMMATURE GRANULOCYTES: 0.1 % (ref 0.0–5.0)
LYMPHOCYTES: 18 % (ref 13–44)
MCH: 27.2 PG (ref 26.1–32.9)
MCHC: 31.6 g/dL (ref 31.4–35.0)
MCV: 86 FL (ref 79.6–97.8)
MONOCYTES: 5 % (ref 4.0–12.0)
MPV: 11.9 FL (ref 10.8–14.1)
NEUTROPHILS: 75 % (ref 43–78)
PLATELET: 260 10*3/uL (ref 150–450)
RBC: 4.56 M/uL (ref 4.23–5.67)
RDW: 17 % — ABNORMAL HIGH (ref 11.9–14.6)
WBC: 8.3 10*3/uL (ref 4.3–11.1)

## 2015-10-15 LAB — METABOLIC PANEL, COMPREHENSIVE
A-G Ratio: 0.9 — ABNORMAL LOW (ref 1.2–3.5)
ALT (SGPT): 21 U/L (ref 12–65)
AST (SGOT): 17 U/L (ref 15–37)
Albumin: 3.5 g/dL (ref 3.5–5.0)
Alk. phosphatase: 97 U/L (ref 50–136)
Anion gap: 8 mmol/L (ref 7–16)
BUN: 11 MG/DL (ref 6–23)
Bilirubin, total: 0.4 MG/DL (ref 0.2–1.1)
CO2: 25 mmol/L (ref 21–32)
Calcium: 8.8 MG/DL (ref 8.3–10.4)
Chloride: 105 mmol/L (ref 98–107)
Creatinine: 1.31 MG/DL (ref 0.8–1.5)
GFR est AA: >60 mL/min/{1.73_m2} (ref >60)
GFR est non-AA: >60 mL/min/{1.73_m2} (ref >60)
Globulin: 3.8 g/dL — ABNORMAL HIGH (ref 2.3–3.5)
Glucose: 105 mg/dL — ABNORMAL HIGH (ref 65–100)
Potassium: 4.1 mmol/L (ref 3.5–5.1)
Protein, total: 7.3 g/dL (ref 6.3–8.2)
Sodium: 138 mmol/L (ref 136–145)

## 2015-10-15 LAB — URINE MICROSCOPIC: Bacteria: 0 /hpf

## 2015-10-15 MED ORDER — SODIUM CHLORIDE 0.9% BOLUS IV
0.9 % | Freq: Once | INTRAVENOUS | Status: AC
Start: 2015-10-15 — End: 2015-10-15
  Administered 2015-10-15: 18:00:00 via INTRAVENOUS

## 2015-10-15 MED ORDER — OXYCODONE-ACETAMINOPHEN 10 MG-325 MG TAB
10-325 mg | ORAL_TABLET | Freq: Four times a day (QID) | ORAL | 0 refills | Status: AC | PRN
Start: 2015-10-15 — End: ?

## 2015-10-15 MED ORDER — SODIUM CHLORIDE 0.9 % IJ SYRG
INTRAMUSCULAR | Status: DC | PRN
Start: 2015-10-15 — End: 2015-10-15

## 2015-10-15 MED ORDER — ONDANSETRON (PF) 4 MG/2 ML INJECTION
4 mg/2 mL | INTRAMUSCULAR | Status: AC
Start: 2015-10-15 — End: 2015-10-15
  Administered 2015-10-15: 18:00:00 via INTRAVENOUS

## 2015-10-15 MED ORDER — HYDROMORPHONE (PF) 1 MG/ML IJ SOLN
1 mg/mL | INTRAMUSCULAR | Status: AC
Start: 2015-10-15 — End: 2015-10-15
  Administered 2015-10-15: 18:00:00 via INTRAVENOUS

## 2015-10-15 MED ORDER — SODIUM CHLORIDE 0.9 % IJ SYRG
Freq: Three times a day (TID) | INTRAMUSCULAR | Status: DC
Start: 2015-10-15 — End: 2015-10-15

## 2015-10-15 MED FILL — ONDANSETRON (PF) 4 MG/2 ML INJECTION: 4 mg/2 mL | INTRAMUSCULAR | Qty: 2

## 2015-10-15 MED FILL — HYDROMORPHONE (PF) 1 MG/ML IJ SOLN: 1 mg/mL | INTRAMUSCULAR | Qty: 1

## 2015-10-15 NOTE — ED Provider Notes (Addendum)
HPI Comments: Patient presents to the ER complaining of worsening right flank pain for the past 3 days.  Patient was recently diagnosed with a kidney stone on 3 days ago and reports pain has continued.  Also reports some nausea and vomiting.  Denies fevers or chills.  Patient states he feels as if he cannot urinate for the past 5 hours although he has the urge to urinate.    Patient is a 34 y.o. male presenting with vomiting. The history is provided by the patient.   Vomiting    This is a new problem. The current episode started more than 2 days ago. The problem occurs 2 to 4 times per day. The problem has not changed since onset.The emesis has an appearance of stomach contents. There has been no fever. Associated symptoms include abdominal pain. Pertinent negatives include no fever, no cough and no URI. Past medical history comments: kidney stone.        Past Medical History:   Diagnosis Date   ??? Cancer ([facility])      testiclar cancer   ??? Ill-defined condition      restless legs       Past Surgical History:   Procedure Laterality Date   ??? Hx other surgical       kidney stones removal.   ??? Hx urological       left testicle removal.         History reviewed. No pertinent family history.    Social History     Social History   ??? Marital status: SINGLE     Spouse name: N/A   ??? Number of children: N/A   ??? Years of education: N/A     Occupational History   ??? Not on file.     Social History Main Topics   ??? Smoking status: Current Every Day Smoker     Packs/day: 0.50   ??? Smokeless tobacco: Not on file   ??? Alcohol use No   ??? Drug use: Not on file   ??? Sexual activity: Not on file     Other Topics Concern   ??? Not on file     Social History Narrative         ALLERGIES: Tramadol    Review of Systems   Constitutional: Negative for diaphoresis, fatigue and fever.   HENT: Negative for congestion and dental problem.    Eyes: Negative for photophobia, redness and visual disturbance.    Respiratory: Negative for cough and chest tightness.    Cardiovascular: Negative for palpitations and leg swelling.   Gastrointestinal: Positive for abdominal pain, nausea and vomiting.   Endocrine: Negative for polydipsia, polyphagia and polyuria.   Genitourinary: Positive for flank pain and urgency. Negative for frequency.   Musculoskeletal: Negative for back pain and gait problem.   Allergic/Immunologic: Negative for food allergies and immunocompromised state.   Neurological: Negative for light-headedness and numbness.   Hematological: Does not bruise/bleed easily.   Psychiatric/Behavioral: Negative for behavioral problems and confusion.   All other systems reviewed and are negative.      Vitals:    10/15/15 1228   BP: 125/75   Pulse: (!) 102   Resp: 16   Temp: 98.2 ??F (36.8 ??C)   SpO2: 97%   Weight: 93 kg (205 lb)   Height: 5\' 11"  (1.803 m)            Physical Exam   Constitutional: He is oriented to person, place, and time. He appears  well-developed and well-nourished. No distress.   HENT:   Head: Normocephalic and atraumatic.   Mouth/Throat: Oropharynx is clear and moist. No oropharyngeal exudate.   Eyes: Conjunctivae and EOM are normal. Pupils are equal, round, and reactive to light. No scleral icterus.   Neck: Normal range of motion. Neck supple. No tracheal deviation present. No thyromegaly present.   Cardiovascular: Normal rate, normal heart sounds and intact distal pulses.  Exam reveals no gallop and no friction rub.    No murmur heard.  Pulmonary/Chest: Effort normal and breath sounds normal. No respiratory distress. He has no wheezes. He has no rales.   Abdominal: Soft. Bowel sounds are normal. He exhibits no distension. There is no tenderness. There is CVA tenderness.   Right CVA tenderness   Musculoskeletal: Normal range of motion. He exhibits no edema, tenderness or deformity.   Neurological: He is alert and oriented to person, place, and time. He has  normal reflexes. No cranial nerve deficit. Coordination normal.   Skin: He is not diaphoretic.   Nursing note and vitals reviewed.       MDM  Number of Diagnoses or Management Options  Acute right flank pain:   Kidney stone:   Diagnosis management comments: Will check labs and urine here to rule out any signs of an infection or worsening renal function    Will treat symptomatically otherwise    2:11 PM  Symptomatically improved, good urine output  Labs show no signs of infection or worsening renal function  We'll discharge patient home  Follow up with urology       Amount and/or Complexity of Data Reviewed  Clinical lab tests: ordered and reviewed  Tests in the radiology section of CPT??: reviewed    Risk of Complications, Morbidity, and/or Mortality  Presenting problems: moderate  Diagnostic procedures: low  Management options: moderate    Patient Progress  Patient progress: stable    ED Course       Procedures

## 2015-10-15 NOTE — ED Notes (Signed)
I have reviewed discharge instructions with the patient.  The patient verbalized understanding. Discharged to lobby to wait on ride.

## 2015-10-15 NOTE — ED Triage Notes (Signed)
Pt states that he has a kidney stone and that he feels like he needs to void but cant.  States that he has been vomiting.

## 2015-10-15 NOTE — ED Notes (Signed)
Bladder scan=291 mL.

## 2015-10-17 LAB — CULTURE, URINE
Culture result:: NO GROWTH
Culture: NO GROWTH

## 2015-10-19 ENCOUNTER — Inpatient Hospital Stay
Admit: 2015-10-19 | Discharge: 2015-10-20 | Disposition: A | Discharge status: Home / Self Care | Payer: Self-pay | Attending: Emergency Medicine

## 2015-10-19 DIAGNOSIS — T40601A Poisoning by unspecified narcotics, accidental (unintentional), initial encounter: Secondary | ICD-10-CM

## 2015-10-19 LAB — METABOLIC PANEL, COMPREHENSIVE
A-G Ratio: 0.9 — ABNORMAL LOW (ref 1.2–3.5)
ALT (SGPT): 34 U/L (ref 12–65)
AST (SGOT): 81 U/L — ABNORMAL HIGH (ref 15–37)
Albumin: 3.4 g/dL — ABNORMAL LOW (ref 3.5–5.0)
Alk. phosphatase: 102 U/L (ref 50–136)
Anion gap: 10 mmol/L (ref 7–16)
BUN: 11 MG/DL (ref 6–23)
Bilirubin, total: 0.3 MG/DL (ref 0.2–1.1)
CO2: 24 mmol/L (ref 21–32)
Calcium: 8.8 MG/DL (ref 8.3–10.4)
Chloride: 106 mmol/L (ref 98–107)
Creatinine: 1.53 MG/DL — ABNORMAL HIGH (ref 0.8–1.5)
GFR est AA: >60 mL/min/{1.73_m2} (ref >60)
GFR est non-AA: 56 mL/min/{1.73_m2} — ABNORMAL LOW (ref >60)
Globulin: 3.9 g/dL — ABNORMAL HIGH (ref 2.3–3.5)
Glucose: 289 mg/dL — ABNORMAL HIGH (ref 65–100)
Potassium: 5.7 mmol/L — ABNORMAL HIGH (ref 3.5–5.1)
Protein, total: 7.3 g/dL (ref 6.3–8.2)
Sodium: 140 mmol/L (ref 136–145)

## 2015-10-19 LAB — CBC WITH AUTOMATED DIFF
ABS. BASOPHILS: 0 10*3/uL (ref 0.0–0.2)
ABS. EOSINOPHILS: 0 10*3/uL (ref 0.0–0.8)
ABS. IMM. GRANS.: 0 10*3/uL (ref 0.0–0.5)
ABS. LYMPHOCYTES: 0.7 10*3/uL (ref 0.5–4.6)
ABS. MONOCYTES: 0.5 10*3/uL (ref 0.1–1.3)
ABS. NEUTROPHILS: 7.1 10*3/uL (ref 1.7–8.2)
BASOPHILS: 0 % (ref 0.0–2.0)
EOSINOPHILS: 0 % — ABNORMAL LOW (ref 0.5–7.8)
HCT: 36.6 % — ABNORMAL LOW (ref 41.1–50.3)
HGB: 11.9 g/dL — ABNORMAL LOW (ref 13.6–17.2)
IMMATURE GRANULOCYTES: 0.4 % (ref 0.0–5.0)
LYMPHOCYTES: 9 % — ABNORMAL LOW (ref 13–44)
MCH: 28.1 PG (ref 26.1–32.9)
MCHC: 32.5 g/dL (ref 31.4–35.0)
MCV: 86.5 FL (ref 79.6–97.8)
MONOCYTES: 6 % (ref 4.0–12.0)
MPV: 11.1 FL (ref 10.8–14.1)
NEUTROPHILS: 85 % — ABNORMAL HIGH (ref 43–78)
PLATELET: 219 10*3/uL (ref 150–450)
RBC: 4.23 M/uL (ref 4.23–5.67)
RDW: 17 % — ABNORMAL HIGH (ref 11.9–14.6)
WBC: 8.3 10*3/uL (ref 4.3–11.1)

## 2015-10-19 LAB — ACETAMINOPHEN: Acetaminophen level: <10 ug/mL — ABNORMAL LOW (ref 10.0–30.0)

## 2015-10-19 LAB — ETHYL ALCOHOL: ALCOHOL(ETHYL),SERUM: <3 MG/DL

## 2015-10-19 LAB — SALICYLATE: Salicylate level: 4 MG/DL (ref 2.8–20.0)

## 2015-10-19 MED ORDER — NALOXONE 1 MG/ML IJ SYRG(AKA NARCAN)
1 mg/mL | INTRAMUSCULAR | Status: AC
Start: 2015-10-19 — End: 2015-10-19
  Administered 2015-10-19: 22:00:00 via INTRAVENOUS

## 2015-10-19 MED ORDER — IBUPROFEN 800 MG TAB
800 mg | ORAL_TABLET | Freq: Four times a day (QID) | ORAL | 0 refills | Status: AC | PRN
Start: 2015-10-19 — End: 2015-10-26

## 2015-10-19 MED ORDER — ONDANSETRON (PF) 4 MG/2 ML INJECTION
4 mg/2 mL | INTRAMUSCULAR | Status: AC
Start: 2015-10-19 — End: 2015-10-19
  Administered 2015-10-19: 22:00:00 via INTRAVENOUS

## 2015-10-19 MED ORDER — SODIUM CHLORIDE 0.9% BOLUS IV
0.9 % | Freq: Once | INTRAVENOUS | Status: AC
Start: 2015-10-19 — End: 2015-10-19
  Administered 2015-10-19: 22:00:00 via INTRAVENOUS

## 2015-10-19 MED FILL — ONDANSETRON (PF) 4 MG/2 ML INJECTION: 4 mg/2 mL | INTRAMUSCULAR | Qty: 2
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
## 2016-04-02 ENCOUNTER — Inpatient Hospital Stay
Admit: 2016-04-02 | Discharge: 2016-04-02 | Disposition: A | Discharge status: Home / Self Care | Payer: Self-pay | Attending: Emergency Medicine

## 2016-04-02 DIAGNOSIS — T23101A Burn of first degree of right hand, unspecified site, initial encounter: Secondary | ICD-10-CM

## 2016-04-02 MED ORDER — ONDANSETRON (PF) 4 MG/2 ML INJECTION
4 mg/2 mL | INTRAMUSCULAR | Status: AC
Start: 2016-04-02 — End: 2016-04-02
  Administered 2016-04-02: 17:00:00 via INTRAVENOUS

## 2016-04-02 MED ORDER — HYDROMORPHONE (PF) 1 MG/ML IJ SOLN
1 mg/mL | INTRAMUSCULAR | Status: AC
Start: 2016-04-02 — End: 2016-04-02
  Administered 2016-04-02: 17:00:00 via INTRAVENOUS

## 2016-04-02 MED ORDER — SILVER SULFADIAZINE 1 % TOPICAL CREAM
1 % | CUTANEOUS | Status: AC
Start: 2016-04-02 — End: 2016-04-02
  Administered 2016-04-02: 19:00:00 via TOPICAL

## 2016-04-02 MED ORDER — HYDROCODONE-ACETAMINOPHEN 5 MG-325 MG TAB
5-325 mg | ORAL_TABLET | ORAL | 0 refills | Status: AC | PRN
Start: 2016-04-02 — End: ?

## 2016-04-02 MED FILL — HYDROMORPHONE (PF) 1 MG/ML IJ SOLN: 1 mg/mL | INTRAMUSCULAR | Qty: 1

## 2016-04-02 MED FILL — SILVER SULFADIAZINE 1 % TOPICAL CREAM: 1 % | CUTANEOUS | Qty: 50

## 2016-04-02 MED FILL — ONDANSETRON (PF) 4 MG/2 ML INJECTION: 4 mg/2 mL | INTRAMUSCULAR | Qty: 2
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
## 2016-04-02 NOTE — ED Notes (Signed)
I have reviewed discharge instructions with the patient.  The patient verbalized understanding.
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
## 2016-05-25 ENCOUNTER — Emergency Department (HOSPITAL_COMMUNITY)
Admission: EM | Admit: 2016-05-25 | Discharge: 2016-05-25 | Disposition: A | Discharge status: Home / Self Care | Payer: Self-pay | Attending: Emergency Medicine | Admitting: Emergency Medicine

## 2016-05-25 ENCOUNTER — Encounter (HOSPITAL_COMMUNITY): Payer: Self-pay | Admitting: Emergency Medicine

## 2016-05-25 DIAGNOSIS — Z79899 Other long term (current) drug therapy: Secondary | ICD-10-CM | POA: Insufficient documentation

## 2016-05-25 DIAGNOSIS — T23002A Burn of unspecified degree of left hand, unspecified site, initial encounter: Secondary | ICD-10-CM | POA: Insufficient documentation

## 2016-05-25 DIAGNOSIS — Y999 Unspecified external cause status: Secondary | ICD-10-CM | POA: Insufficient documentation

## 2016-05-25 DIAGNOSIS — Y9289 Other specified places as the place of occurrence of the external cause: Secondary | ICD-10-CM | POA: Insufficient documentation

## 2016-05-25 DIAGNOSIS — Y939 Activity, unspecified: Secondary | ICD-10-CM | POA: Insufficient documentation

## 2016-05-25 DIAGNOSIS — F17200 Nicotine dependence, unspecified, uncomplicated: Secondary | ICD-10-CM | POA: Insufficient documentation

## 2016-05-25 DIAGNOSIS — W292XXA Contact with other powered household machinery, initial encounter: Secondary | ICD-10-CM | POA: Insufficient documentation

## 2016-05-25 NOTE — ED Provider Notes (Signed)
CSN: [id number]     Arrival date & time 05/25/16  1052 History   First MD Initiated Contact with Patient 05/25/16 1134     Chief Complaint  Patient presents with  . Hand Burn     (Consider location/radiation/quality/duration/timing/severity/associated sxs/prior Treatment) HPI 35 year old left hand dominant male who presents with burn the the left hand. States onset of burn just PTA. He accidentally burned the palm of the left hand on a manifold of a pressure washer. Tetanus up to date within last year. No fever, chills, drainage. No other injuries.   History reviewed. No pertinent past medical history. History reviewed. No pertinent past surgical history. History reviewed. No pertinent family history. Social History  Substance Use Topics  . Smoking status: Current Every Day Smoker  . Smokeless tobacco: None  . Alcohol Use: No    Review of Systems  10/14 systems reviewed and are negative other than those stated in the HPI   Allergies  Review of patient's allergies indicates no known allergies.  Home Medications   Prior to Admission medications   Medication Sig Start Date End Date Taking? Authorizing Provider  Multiple Vitamin (MULTIVITAMIN WITH MINERALS) TABS tablet Take 1 tablet by mouth daily.   Yes Historical Provider, MD  rOPINIRole (REQUIP) 3 MG tablet Take 3 mg by mouth 3 (three) times daily.   Yes Historical Provider, MD   BP 148/105 mmHg  Pulse 85  Temp(Src) 98.3 F (36.8 C) (Oral)  Resp 16  Ht 5\' 11"  (1.803 m)  Wt 206 lb (93.441 kg)  BMI 28.74 kg/m2  SpO2 100% Physical Exam Physical Exam  Nursing note and vitals reviewed. Constitutional: Well developed, well nourished, non-toxic, and in no acute distress Head: Normocephalic and atraumatic.  Mouth/Throat: Oropharynx is clear and moist.  Neck: Normal range of motion. Neck supple.  Cardiovascular: Normal rate and regular rhythm. +2 radial pulse  Pulmonary/Chest: Effort normal and breath sounds normal.   Abdominal: Soft. There is no tenderness. There is no rebound and no guarding.  Musculoskeletal: Normal range of motion.  Neurological: Alert, no facial droop, fluent speech, moves all extremities symmetrically, intact innervation involving the median, ulnar, and radial nerves of the left hand Skin: Skin is warm and dry.  there is evidence of well-healing burn over the palmar surface of the left hand and distal second through fifth digits. No drainage or peeling. No significant overlying erythema. Psychiatric: Cooperative  ED Course  Procedures (including critical care time) Labs Review Labs Reviewed - No data to display  Imaging Review No results found. I have personally reviewed and evaluated these images and lab results as part of my medical decision-making.   EKG Interpretation None      MDM   Final diagnoses:  None    35 year old male who presents with burn of the left palm, reportedly occurring just prior to arrival. On chart review he was seen at the [facility] emergency department one week ago on June 15 for second degree burn involving the left palm after grabbing onto a hot muffler. When I initially asked him about his initial burn he said that he did not seek any medical attention for previous burn, and that this was incurred today. When I showed him his emergency room record, he states that it was not of his left hand but the right hand and that it was documented incorrectly. On my evaluation he does have evidence of a healing burn over the left palm involving both the hyperthenar and thenar  eminence as well as the proximal second through fifth digits. The skin is intact and there is no evidence of infection. It does not look like an acute burn. I am suspicious that he is here for alternative motives. Discussed continued follow-up at [facility] burn center as needed as previously referred by [facility]. Discussed continued pain management with OTC analgesics.     Forde Dandy, MD 05/25/16  1201

## 2016-05-25 NOTE — ED Notes (Addendum)
Went in to see patient after PA notified of discharge.  Pt not in room.  Paperwork not given.  Unable to get d/c vitals or pain assessment.

## 2016-05-25 NOTE — ED Notes (Signed)
Pt grabbed hose of pressure washer this am and burned palm of L hand. Blistering noted to palm

## 2016-05-25 NOTE — Discharge Instructions (Signed)
Continue routine wound care as with your prior burn. Please follow-up at burn clinic below. Take motrin and tylenol for pain.  You can reach the [facility] at: Phone [phone] Please set up follow-up appointment.    Burn Care Your skin is a natural barrier to infection. It is the largest organ of your body. Burns damage this natural protection. To help prevent infection, it is very important to follow your caregiver's instructions in the care of your burn. Burns are classified as:  First degree. There is only redness of the skin (erythema). No scarring is expected.  Second degree. There is blistering of the skin. Scarring may occur with deeper burns.  Third degree. All layers of the skin are injured, and scarring is expected. HOME CARE INSTRUCTIONS   Wash your hands well before changing your bandage.  Change your bandage as often as directed by your caregiver.  Remove the old bandage. If the bandage sticks, you may soak it off with cool, clean water.  Cleanse the burn thoroughly but gently with mild soap and water.  Pat the area dry with a clean, dry cloth.  Apply a thin layer of antibacterial cream to the burn.  Apply a clean bandage as instructed by your caregiver.  Keep the bandage as clean and dry as possible.  Elevate the affected area for the first 24 hours, then as instructed by your caregiver.  Only take over-the-counter or prescription medicines for pain, discomfort, or fever as directed by your caregiver. SEEK IMMEDIATE MEDICAL CARE IF:   You develop excessive pain.  You develop redness, tenderness, swelling, or red streaks near the burn.  The burned area develops yellowish-white fluid (pus) or a bad smell.  You have a fever. MAKE SURE YOU:   Understand these instructions.  Will watch your condition.  Will get help right away if you are not doing well or get worse.   This information is not intended to replace advice given to you by your  health care provider. Make sure you discuss any questions you have with your health care provider.   Document Released: 11/21/2005 Document Revised: 02/13/2012 Document Reviewed: 04/13/2011 Elsevier Interactive Patient Education [employer].

## 2016-07-10 ENCOUNTER — Emergency Department (HOSPITAL_COMMUNITY)
Admission: EM | Admit: 2016-07-10 | Discharge: 2016-07-10 | Disposition: A | Discharge status: Home / Self Care | Payer: Self-pay | Attending: Emergency Medicine | Admitting: Emergency Medicine

## 2016-07-10 ENCOUNTER — Encounter (HOSPITAL_COMMUNITY): Payer: Self-pay | Admitting: Oncology

## 2016-07-10 ENCOUNTER — Inpatient Hospital Stay (HOSPITAL_COMMUNITY)
Admission: EM | Admit: 2016-07-10 | Discharge: 2016-07-12 | DRG: 683 | Disposition: A | Discharge status: Home / Self Care | Payer: Self-pay | Attending: Internal Medicine | Admitting: Internal Medicine

## 2016-07-10 ENCOUNTER — Emergency Department (HOSPITAL_COMMUNITY): Payer: Self-pay

## 2016-07-10 ENCOUNTER — Encounter (HOSPITAL_COMMUNITY): Payer: Self-pay | Admitting: *Deleted

## 2016-07-10 DIAGNOSIS — Z8547 Personal history of malignant neoplasm of testis: Secondary | ICD-10-CM | POA: Insufficient documentation

## 2016-07-10 DIAGNOSIS — N179 Acute kidney failure, unspecified: Principal | ICD-10-CM | POA: Diagnosis present

## 2016-07-10 DIAGNOSIS — G2581 Restless legs syndrome: Secondary | ICD-10-CM | POA: Diagnosis present

## 2016-07-10 DIAGNOSIS — F1910 Other psychoactive substance abuse, uncomplicated: Secondary | ICD-10-CM | POA: Insufficient documentation

## 2016-07-10 DIAGNOSIS — Z720 Tobacco use: Secondary | ICD-10-CM | POA: Diagnosis present

## 2016-07-10 DIAGNOSIS — F1010 Alcohol abuse, uncomplicated: Secondary | ICD-10-CM | POA: Insufficient documentation

## 2016-07-10 DIAGNOSIS — Z79899 Other long term (current) drug therapy: Secondary | ICD-10-CM | POA: Insufficient documentation

## 2016-07-10 DIAGNOSIS — F17210 Nicotine dependence, cigarettes, uncomplicated: Secondary | ICD-10-CM | POA: Insufficient documentation

## 2016-07-10 DIAGNOSIS — C801 Malignant (primary) neoplasm, unspecified: Secondary | ICD-10-CM | POA: Diagnosis present

## 2016-07-10 DIAGNOSIS — R569 Unspecified convulsions: Secondary | ICD-10-CM

## 2016-07-10 DIAGNOSIS — E860 Dehydration: Secondary | ICD-10-CM | POA: Diagnosis present

## 2016-07-10 DIAGNOSIS — F10239 Alcohol dependence with withdrawal, unspecified: Secondary | ICD-10-CM | POA: Diagnosis present

## 2016-07-10 DIAGNOSIS — F1410 Cocaine abuse, uncomplicated: Secondary | ICD-10-CM | POA: Diagnosis present

## 2016-07-10 HISTORY — DX: Acute kidney failure, unspecified: N17.9

## 2016-07-10 HISTORY — DX: Tobacco use: Z72.0

## 2016-07-10 HISTORY — DX: Alcohol abuse, uncomplicated: F10.10

## 2016-07-10 HISTORY — DX: Unspecified convulsions: R56.9

## 2016-07-10 HISTORY — DX: Malignant (primary) neoplasm, unspecified: C80.1

## 2016-07-10 LAB — I-STAT TROPONIN, ED: Troponin i, poc: 0 ng/mL (ref 0.00–0.08)

## 2016-07-10 LAB — COMPREHENSIVE METABOLIC PANEL
ALT: 22 U/L (ref 17–63)
AST: 22 U/L (ref 15–41)
Albumin: 3.3 g/dL — ABNORMAL LOW (ref 3.5–5.0)
Alkaline Phosphatase: 74 U/L (ref 38–126)
Anion gap: 7 (ref 5–15)
BILIRUBIN TOTAL: 0.5 mg/dL (ref 0.3–1.2)
BUN: 5 mg/dL — AB (ref 6–20)
CALCIUM: 8.6 mg/dL — AB (ref 8.9–10.3)
CO2: 23 mmol/L (ref 22–32)
CREATININE: 1.26 mg/dL — AB (ref 0.61–1.24)
Chloride: 108 mmol/L (ref 101–111)
GFR calc Af Amer: >60 mL/min (ref >60)
Glucose, Bld: 101 mg/dL — ABNORMAL HIGH (ref 65–99)
Potassium: 3.9 mmol/L (ref 3.5–5.1)
Sodium: 138 mmol/L (ref 135–145)
TOTAL PROTEIN: 6 g/dL — AB (ref 6.5–8.1)

## 2016-07-10 LAB — CBC
HEMATOCRIT: 49.6 % (ref 39.0–52.0)
HEMOGLOBIN: 17.6 g/dL — AB (ref 13.0–17.0)
MCH: 31.5 pg (ref 26.0–34.0)
MCHC: 35.5 g/dL (ref 30.0–36.0)
MCV: 88.7 fL (ref 78.0–100.0)
Platelets: 300 10*3/uL (ref 150–400)
RBC: 5.59 MIL/uL (ref 4.22–5.81)
RDW: 13 % (ref 11.5–15.5)
WBC: 8.8 10*3/uL (ref 4.0–10.5)

## 2016-07-10 LAB — CBC WITH DIFFERENTIAL/PLATELET
BASOS ABS: 0 10*3/uL (ref 0.0–0.1)
Basophils Relative: 1 %
Eosinophils Absolute: 1 10*3/uL — ABNORMAL HIGH (ref 0.0–0.7)
Eosinophils Relative: 14 %
HEMATOCRIT: 48.4 % (ref 39.0–52.0)
Hemoglobin: 16.4 g/dL (ref 13.0–17.0)
LYMPHS ABS: 2 10*3/uL (ref 0.7–4.0)
LYMPHS PCT: 28 %
MCH: 30.9 pg (ref 26.0–34.0)
MCHC: 33.9 g/dL (ref 30.0–36.0)
MCV: 91.3 fL (ref 78.0–100.0)
MONO ABS: 0.4 10*3/uL (ref 0.1–1.0)
Monocytes Relative: 6 %
NEUTROS ABS: 3.6 10*3/uL (ref 1.7–7.7)
Neutrophils Relative %: 51 %
Platelets: 258 10*3/uL (ref 150–400)
RBC: 5.3 MIL/uL (ref 4.22–5.81)
RDW: 13.1 % (ref 11.5–15.5)
WBC: 7.1 10*3/uL (ref 4.0–10.5)

## 2016-07-10 LAB — BASIC METABOLIC PANEL
Anion gap: 11 (ref 5–15)
BUN: 7 mg/dL (ref 6–20)
CO2: 23 mmol/L (ref 22–32)
Calcium: 8.9 mg/dL (ref 8.9–10.3)
Chloride: 107 mmol/L (ref 101–111)
Creatinine, Ser: 1.35 mg/dL — ABNORMAL HIGH (ref 0.61–1.24)
GLUCOSE: 120 mg/dL — AB (ref 65–99)
Potassium: 3.9 mmol/L (ref 3.5–5.1)
SODIUM: 141 mmol/L (ref 135–145)

## 2016-07-10 LAB — RAPID URINE DRUG SCREEN, HOSP PERFORMED
Amphetamines: NOT DETECTED
Barbiturates: NOT DETECTED
Benzodiazepines: POSITIVE — AB
Cocaine: POSITIVE — AB
Opiates: NOT DETECTED
Tetrahydrocannabinol: NOT DETECTED

## 2016-07-10 LAB — TSH: TSH: 0.974 u[IU]/mL (ref 0.350–4.500)

## 2016-07-10 LAB — ETHANOL: ALCOHOL ETHYL (B): 84 mg/dL — AB (ref <5)

## 2016-07-10 MED ORDER — LORAZEPAM 2 MG/ML IJ SOLN
2.0000 mg | Freq: Once | INTRAMUSCULAR | Status: AC
  Administered 2016-07-10: 2 mg via INTRAVENOUS
  Filled 2016-07-10: qty 1

## 2016-07-10 MED ORDER — LORAZEPAM 1 MG PO TABS: 1.0000 mg | ORAL_TABLET | Freq: Four times a day (QID) | ORAL | Status: DC | PRN

## 2016-07-10 MED ORDER — LORAZEPAM 2 MG/ML IJ SOLN
1.0000 mg | Freq: Four times a day (QID) | INTRAMUSCULAR | Status: DC | PRN
Start: 2016-07-10 — End: 2016-07-10

## 2016-07-10 MED ORDER — ONDANSETRON 4 MG PO TBDP
4.0000 mg | ORAL_TABLET | Freq: Four times a day (QID) | ORAL | Status: DC | PRN
  Administered 2016-07-10: 4 mg via ORAL
  Filled 2016-07-10: qty 1

## 2016-07-10 MED ORDER — THIAMINE HCL 100 MG/ML IJ SOLN: 100.0000 mg | Freq: Every day | INTRAMUSCULAR | Status: DC

## 2016-07-10 MED ORDER — CHLORDIAZEPOXIDE HCL 25 MG PO CAPS: ORAL_CAPSULE | ORAL | 0 refills | Status: DC

## 2016-07-10 MED ORDER — THIAMINE HCL 100 MG/ML IJ SOLN
Freq: Once | INTRAVENOUS | Status: AC
  Administered 2016-07-10: 10:00:00 via INTRAVENOUS
  Filled 2016-07-10: qty 1000

## 2016-07-10 MED ORDER — ENOXAPARIN SODIUM 40 MG/0.4ML ~~LOC~~ SOLN
40.0000 mg | SUBCUTANEOUS | Status: DC
  Administered 2016-07-10 – 2016-07-11 (×2): 40 mg via SUBCUTANEOUS
  Filled 2016-07-10 (×2): qty 0.4

## 2016-07-10 MED ORDER — FOLIC ACID 1 MG PO TABS
1.0000 mg | ORAL_TABLET | Freq: Every day | ORAL | Status: DC
  Administered 2016-07-11 – 2016-07-12 (×2): 1 mg via ORAL
  Filled 2016-07-10 (×2): qty 1

## 2016-07-10 MED ORDER — SODIUM CHLORIDE 0.9 % IV BOLUS (SEPSIS)
500.0000 mL | Freq: Once | INTRAVENOUS | Status: AC
  Administered 2016-07-10: 500 mL via INTRAVENOUS

## 2016-07-10 MED ORDER — VITAMIN B-1 100 MG PO TABS
100.0000 mg | ORAL_TABLET | Freq: Every day | ORAL | Status: DC
  Administered 2016-07-11 – 2016-07-12 (×2): 100 mg via ORAL
  Filled 2016-07-10 (×2): qty 1

## 2016-07-10 MED ORDER — ONDANSETRON HCL 4 MG/2ML IJ SOLN: 4.0000 mg | Freq: Four times a day (QID) | INTRAMUSCULAR | Status: DC | PRN

## 2016-07-10 MED ORDER — ROPINIROLE HCL 1 MG PO TABS
2.0000 mg | ORAL_TABLET | Freq: Three times a day (TID) | ORAL | Status: DC
  Administered 2016-07-10 – 2016-07-12 (×6): 2 mg via ORAL
  Filled 2016-07-10 (×6): qty 2

## 2016-07-10 MED ORDER — LORAZEPAM 2 MG/ML IJ SOLN
1.0000 mg | INTRAMUSCULAR | Status: DC | PRN
  Administered 2016-07-11: 1 mg via INTRAVENOUS
  Filled 2016-07-10: qty 1

## 2016-07-10 MED ORDER — ACETAMINOPHEN 650 MG RE SUPP: 650.0000 mg | Freq: Four times a day (QID) | RECTAL | Status: DC | PRN

## 2016-07-10 MED ORDER — SODIUM CHLORIDE 0.9 % IV SOLN
INTRAVENOUS | Status: AC
  Administered 2016-07-10: 14:00:00 via INTRAVENOUS

## 2016-07-10 MED ORDER — LORAZEPAM 1 MG PO TABS
1.0000 mg | ORAL_TABLET | ORAL | Status: DC | PRN
  Administered 2016-07-10 – 2016-07-12 (×3): 1 mg via ORAL
  Filled 2016-07-10 (×3): qty 1

## 2016-07-10 MED ORDER — THIAMINE HCL 100 MG/ML IJ SOLN
Freq: Once | INTRAVENOUS | Status: DC
  Filled 2016-07-10: qty 1000

## 2016-07-10 MED ORDER — ACETAMINOPHEN 325 MG PO TABS
650.0000 mg | ORAL_TABLET | Freq: Four times a day (QID) | ORAL | Status: DC | PRN
  Administered 2016-07-11 – 2016-07-12 (×2): 650 mg via ORAL
  Filled 2016-07-10 (×2): qty 2

## 2016-07-10 MED ORDER — ONDANSETRON HCL 4 MG PO TABS: 4.0000 mg | ORAL_TABLET | Freq: Four times a day (QID) | ORAL | Status: DC | PRN

## 2016-07-10 MED ORDER — HYDROXYZINE HCL 25 MG PO TABS: 25.0000 mg | ORAL_TABLET | Freq: Four times a day (QID) | ORAL | Status: DC | PRN

## 2016-07-10 MED ORDER — ADULT MULTIVITAMIN W/MINERALS CH
1.0000 | ORAL_TABLET | Freq: Every day | ORAL | Status: DC
  Administered 2016-07-11 – 2016-07-12 (×2): 1 via ORAL
  Filled 2016-07-10 (×2): qty 1

## 2016-07-10 MED ORDER — LOPERAMIDE HCL 2 MG PO CAPS
2.0000 mg | ORAL_CAPSULE | ORAL | Status: DC | PRN
Start: 2016-07-10 — End: 2016-07-10

## 2016-07-10 NOTE — ED Provider Notes (Signed)
[place] DEPT Provider Note   CSN: [id number] Arrival date & time: 07/10/16  [id number]  First Provider Contact:  None       History   Chief Complaint Chief Complaint  Patient presents with  . Seizures  . Alcohol Problem    HPI Thomas Blake is a 35 y.o. male.  HPI  Patient with a h/o tobacco and alcohol use was seen earlier this morning at another ED for ETOH use and reported cocaine use over the past 2 days, who was discharged on a librium taper, now presents for concern for shaking.  He was brought in by the roommate. I discussed this with the roommate and he reports that earlier this morning, the patient had an episode where his "eyes glazed over and rolled back in his head" and then he was "not responding, and shaking for about 2 minutes." The roommate and reports after this episode he was confused for a few minutes. In talking to the patient here he states that he still feels confused. The patient's complaint is that he feels anxious, but he thinks he is shaking, and that he has a headache. His current headache is the back of his head, 10 of 10, throbbing, nonradiating. He does not report any falls. He also states that he doesn't remember leaving the hospital yesterday, and he doesn't know or his prescription is.  Past Medical History:  Diagnosis Date  . 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   History reviewed. No pertinent surgical history.     Home Medications    Prior to Admission medications   Medication Sig Start Date End Date Taking? Authorizing Provider  chlordiazePOXIDE (LIBRIUM) 25 MG capsule 50mg  PO TID x 1D, then 25-50mg  PO BID X 1D, then 25-50mg  PO QD X 1D 07/10/16   Varney Biles, MD  Multiple Vitamin (MULTIVITAMIN WITH MINERALS) TABS tablet Take 1 tablet by mouth daily.    Historical Provider, MD  rOPINIRole (REQUIP) 3 MG tablet Take 3 mg by mouth 3 (three) times daily.    Historical Provider, MD      Family History History reviewed. No pertinent family history.  Social History Social History  Substance Use Topics  . Smoking status: Current Every Day Smoker    Types: Cigarettes  . Smokeless tobacco: Never Used  . Alcohol use Yes     Comment: 1/2 gallon of vodka daily     Allergies   Tramadol   Review of Systems Review of Systems  Constitutional: Negative for chills and fever.  HENT: Negative for ear pain and sore throat.   Eyes: Negative for pain and visual disturbance.  Respiratory: Negative for cough and shortness of breath.   Cardiovascular: Negative for chest pain and palpitations.  Gastrointestinal: Negative for abdominal pain and vomiting.  Genitourinary: Negative for dysuria and hematuria.  Musculoskeletal: Negative for arthralgias and back pain.  Skin: Negative for color change and rash.  Neurological: Positive for tremors, seizures and headaches. Negative for syncope and speech difficulty.  All other systems reviewed and are negative.    Physical Exam Updated Vital Signs BP (!) 106/42   Pulse 94   Temp 98.6 F (37 C) (Oral)   Resp 18   SpO2 100%   Physical Exam  Constitutional: He is oriented to person, place, and time. He appears well-developed and well-nourished.  HENT:  Head: Normocephalic and atraumatic.  Eyes: Conjunctivae are normal.  Neck: Neck supple.  Cardiovascular: Normal rate and regular rhythm.   No murmur heard. Pulmonary/Chest: Effort normal and breath sounds normal. No respiratory distress.  Abdominal: Soft. There is no tenderness.  Musculoskeletal: He exhibits no edema.  Neurological: He is alert and oriented to person, place, and time.  CN II-XII grossly intact Eyes: PERRL, EOMI Bilateral UE strength 5/5 Bilateral LE strength 5/5 Mild, symmetric hand tremor initially  Skin: Skin is warm and dry.  Psychiatric: He has a normal mood and affect.  Nursing note and vitals reviewed.    ED Treatments / Results  Labs (all  labs ordered are listed, but only abnormal results are displayed) Labs Reviewed - No data to display  EKG  EKG Interpretation None       Radiology Dg Chest 2 View  Result Date: 07/10/2016 CLINICAL DATA:  Left-sided chest pain for 1 hour. Vomiting earlier today. EXAM: CHEST  2 VIEW COMPARISON:  None. FINDINGS: The cardiomediastinal contours are normal. The lungs are clear. Pulmonary vasculature is normal. No consolidation, pleural effusion, or pneumothorax. No acute osseous abnormalities are seen. IMPRESSION: No acute pulmonary process. Electronically Signed   By: Jeb Levering M.D.   On: 07/10/2016 02:37    Procedures Procedures (including critical care time)  Medications Ordered in ED Medications - No data to display   Initial Impression / Assessment and Plan / ED Course  I have reviewed the triage vital signs and the nursing notes.  Pertinent labs & imaging results that were available during my care of the patient were reviewed by me and considered in my medical decision making (see chart for details).  Clinical Course    This patient unfortunately does have a significant history of alcohol abuse, with cessation in the past 24 hours. He states he stopped drinking in an attempt to quit. He is in the time frame that we would would be concerned about withdrawal seizures. He has no objective findings of alcohol withdrawal my exam here, however given the concerning story for seizure we'll place him on CIWA protocol and give Ativan.  Initial CIWA 16, given 2mg  ativan, repeat 7.  No evidence of head trauma, and CT head NAICA.  Labs with mild Cr elevation likely due to dehydration, so fluids given here.    I have significant concern for ETOH withdrawal in this patient with concerning story for seizure this morning. I have low confidence in his ability to comply with outpatient management, given his lack of recollection of his previous ED visit today.  This patient will be admitted for  ETOH withdrawal, with likely seizure activity.   Final Clinical Impressions(s) / ED Diagnoses   Final diagnoses:  None    New Prescriptions New Prescriptions   No medications on file     Levada Schilling, MD 07/10/16 1237    Levada Schilling, MD 07/10/16 [id number]    Charlesetta Shanks, MD 07/10/16 1353

## 2016-07-10 NOTE — ED Notes (Signed)
Attempted report x1. 

## 2016-07-10 NOTE — Progress Notes (Signed)
Pt admitted to room 5w29 from ED, belongings with patient. Pt oriented to room, fall precautions, phone and plan for the day.

## 2016-07-10 NOTE — ED Provider Notes (Signed)
[place] DEPT Provider Note   CSN: [id number] Arrival date & time: 07/10/16  0151  First Provider Contact:   First MD Initiated Contact with Patient 07/10/16 0340       By signing my name below, I, Julien Nordmann, attest that this documentation has been prepared under the direction and in the presence of Varney Biles, MD.  Electronically Signed: Julien Nordmann, ED Scribe. 07/10/16. 4:07 AM.    History   Chief Complaint Chief Complaint  Patient presents with  . Other    ETOH detox  . Chest Pain    The history is provided by the patient and the EMS personnel. No language interpreter was used.   HPI Comments: Thomas Blake is a 35 y.o. male brought in by ambulance, who presents to the Emergency Department presenting with ETOH detox. Pt states the last time he drank was this morning. Pt says he has not tried to quit ETOH use in the past and this is the first time. He reports having bilateral leg pain due to having restless leg syndrome and nausea. He notes taking requip to help with his pain with no relief. Pt reports using cocaine twice over the past two days. He didn't mention of any chest pain to [place] on ROS, but had mentioned chest pain at some point to ER tech.     Past Medical History:  Diagnosis Date  . 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   History reviewed. No pertinent surgical history.     Home Medications    Prior to Admission medications   Medication Sig Start Date End Date Taking? Authorizing Provider  Multiple Vitamin (MULTIVITAMIN WITH MINERALS) TABS tablet Take 1 tablet by mouth daily.    Historical Provider, MD  rOPINIRole (REQUIP) 3 MG tablet Take 3 mg by mouth 3 (three) times daily.    Historical Provider, MD    Family History No family history on file.  Social History Social History  Substance Use Topics  . Smoking status: Current Every Day Smoker    Types: Cigarettes  . Smokeless  tobacco: Never Used  . Alcohol use Yes     Comment: 1/2 gallon of vodka daily     Allergies   Review of patient's allergies indicates no known allergies.   Review of Systems Review of Systems  A complete 10 system review of systems was obtained and all systems are negative except as noted in the HPI and PMH.     Physical Exam Updated Vital Signs BP 110/63 (BP Location: Right Arm)   Pulse 104   Temp 98.1 F (36.7 C) (Oral)   Resp 14   Ht 5\' 11"  (1.803 m)   Wt 205 lb (93 kg)   SpO2 98%   BMI 28.59 kg/m   Physical Exam  Constitutional: He is oriented to person, place, and time. He appears well-developed and well-nourished.  HENT:  Head: Normocephalic.  Eyes: EOM are normal.  Neck: Normal range of motion.  Pulmonary/Chest: Effort normal.  Abdominal: He exhibits no distension.  Musculoskeletal: Normal range of motion.  Neurological: He is alert and oriented to person, place, and time.  Psychiatric: He has a normal mood and affect.  Nursing note and vitals reviewed.    ED Treatments / Results  DIAGNOSTIC STUDIES: Oxygen Saturation is 98% on RA, normal by my interpretation.  COORDINATION OF CARE:  3:45 AM Discussed treatment plan which includes EKG with pt  at bedside and pt agreed to plan.   Per nursing note, pt was complaining of chest pain so an EKG and troponin were obtained.     Labs (all labs ordered are listed, but only abnormal results are displayed) Labs Reviewed  BASIC METABOLIC PANEL - Abnormal; Notable for the following:       Result Value   Glucose, Bld 120 (*)    Creatinine, Ser 1.35 (*)    All other components within normal limits  CBC - Abnormal; Notable for the following:    Hemoglobin 17.6 (*)    All other components within normal limits  I-STAT TROPOININ, ED    EKG  EKG Interpretation  Date/Time:  Sunday July 10 2016 02:11:55 EDT Ventricular Rate:  99 PR Interval:    QRS Duration: 83 QT Interval:  338 QTC Calculation: 434 R  Axis:   65 Text Interpretation:  Sinus rhythm ST elev, probable normal early repol pattern No acute changes No old tracing to compare Confirmed by Kathrynn Humble, MD, Thelma Comp [id number]) on 07/10/2016 2:20:29 AM       Radiology Dg Chest 2 View  Result Date: 07/10/2016 CLINICAL DATA:  Left-sided chest pain for 1 hour. Vomiting earlier today. EXAM: CHEST  2 VIEW COMPARISON:  None. FINDINGS: The cardiomediastinal contours are normal. The lungs are clear. Pulmonary vasculature is normal. No consolidation, pleural effusion, or pneumothorax. No acute osseous abnormalities are seen. IMPRESSION: No acute pulmonary process. Electronically Signed   By: Jeb Levering M.D.   On: 07/10/2016 02:37    Procedures Procedures (including critical care time)  Medications Ordered in ED Medications - No data to display   Initial Impression / Assessment and Plan / ED Course  I have reviewed the triage vital signs and the nursing notes.  Pertinent labs & imaging results that were available during my care of the patient were reviewed by me and considered in my medical decision making (see chart for details).  Clinical Course      Final Clinical Impressions(s) / ED Diagnoses   Final diagnoses:  Polysubstance abuse  Alcohol abuse   I personally performed the services described in this documentation, which was scribed in my presence. The recorded information has been reviewed and is accurate.  Pt comes in seeking detox from alcohol. He is not having DTs or severe withdrawals right now. We will start him on librium. Pt had mentioned chest pain at triage - so labs and ekg done - which are neg for acute process. He had not c/o chest pain during our evaluation, just leg pain, so we will not further pursue that complaint.   New Prescriptions New Prescriptions   No medications on file     Varney Biles, MD 07/10/16 [phone]

## 2016-07-10 NOTE — Discharge Instructions (Signed)
Substance Abuse Treatment Programs ° °Intensive Outpatient Programs °[facility]     °[address]      °[place], [place]                   °[phone]      ° °[facility] °[address] °[place], [place] °[phone] ° °[facility]     °(Inpatient and outpatient)     °[address]           °[phone]   ° °[facility] °336-288-1484 (Suboxone and Methadone) ° °[address]      °[place], [place] [address]      °336-882-2125      ° °[address] °[place], [place] °852-3033 ° °[facility] (Outpatient/Inpatient, [facility])    °(insurance only) [phone]      °       °[facility] (Groups & Residential) °[place], [place] °336-389-1413 ° °   °[facility]     °[address]     °[place], [place]      °[phone]      ° °[facility] (for caregivers and family) °[address] °[place], [place] °336-299-4655 ° ° ° ° ° °Residential Treatment Programs °[facility]      °[address], [place], [place] [address]  °([phone]      ° °[facility] °[address] [place], [place] [address] °[phone] ° °[facility]        °336-248-8914      ° °[facility] °1-800-659-3381 ° °[facility]             °[address]                                         °[place], [place]                                                °877-615-2722 or 336-784-9470                              ° °[facility] [place] °[address] °[place], [address] °1.877.941.8954 ° °[facility]    °[address]      °[place], [place]     °[phone]      ° °[facility] °[address] °[place], [place] °336-285-9073 ° °[facility]   °[address]     °[place], [place] [address]     °[phone]      °Admissions: 8am-3pm M-F ° °[facility] °[address] °[place] °336-227-7417 ° °[facility]: Residential Program (90 Days)   °[place], [place]      °336-725-8389 or 800-758-6077    ° °[facility] °[place], [place] °(Walk in Hours over the weekend or by referral) ° °[facility] °[address], [place], [place] [address] °(336) [phone] ° °Crisis Mobile: [facility]:  1-877-626-1772 (for crisis response 24 hours a day) °[facility] Hotline:      1-800-256-2452 °Outpatient Psychiatry and Counseling ° °[facility]: Mobile Crisis   Management 24 hours:  1-877-626-1772 ° °[facility] sliding scale fee and walk in schedule: M-F 8am-12pm/1pm-3pm °[address]  °[place], [place] [address] °[phone] ° °[facility] °[address] °[place], [place] [address] °336-703-9650 ° °[facility] ([facility] known [facility])- new patient walk-in appointments available Monday - Friday 8am -3pm.          °[address] °[place], [place] [address] °336-676-6840 or crisis line- 336-676-6905 ° °[facility] Intensive Outpatient Therapy Program °[address] °[place], [place] [address] °336-832-9804 ° °[facility]                  °Crisis Services      °[phone]      °[address]     °[place], [place] [address]                ° °[facility]   °[facility] °[phone] °[address] °[place], [place] [address] ° ° °[facility]          °[address],  °[place], [place] [address]       °([phone] ° °[facility] °[address] °[place], [place] [address] °336-292-1510 ° °[facility]    °[address]    °[place], [place] [address]     °[phone]      ° °Parish McKinney, MD     °[address]     °[place] [address]     °336-282-1251     °  °[facility] °[address] °[place] [address] ° °[facility]     °[address]     °[place], [place]      °336-542-2076      ° °[facility] °Shamsher Ahluwalia, MD °[address] °[place], [place] [address] °[phone] ° °[facility]     °[address]     °[place], [place] [address]     °336-274-1237      ° °[facility] °[address] °[place], [place] [address] °336-854-4450 °Resources for Temporary Residential Assistance/Crisis Centers ° °DAY CENTERS °[facility] °M-F 8am-3pm   °[address] [place], [place] [address]   336-332-0824 °Services include: laundry, barbering, support groups, case management, phone  & computer access, showers, AA/NA mtgs, mental health/substance abuse nurse, job skills class, disability information, VA assistance, spiritual classes, etc.  ° °HOMELESS SHELTERS ° °[facility]     °[facility]   °[address], [place]     °[phone]       °       °[facility] (women and children)       °[address] °[place], [place] [address] °336-275-0820 °[email or web address] for application and process °Application Required ° °[facility]   °[address]    °[place] [address]     °[phone]       °             °[facility] °[address] °[place], [place] [address] °336.273.5572 °336-235-0363(schedule application appt.) °Application Required ° °[facility] (women only)    °[address]     °[place],  [address]     °336-884-1039      °  Intake starts 6pm daily °Need valid ID, SSC, & Police report °[facility] [place] °[address] °[place], [place] °336-881-5420 °Application Required ° °[facility] (men only)     °[address]      °[place], [place]     °[phone]      ° °[facility] °(Pregnant women only) °[address] °[place], [place] °336-275-0206 ° °[facility]      °[address]      °[place], [place] [address]     °[phone]      °       °[facility] °[address] °[place], [place] °336-723-1848 °90 day commitment/SA/Application process ° °[facility](men only)     °[address]     °[place], [place]     °[phone]       °Check-in at 7pm     °       °[facility] °[address] °[place], [place] [address] °336-248-6684 °Men/Women/Women and Children must be there by 7 pm ° °[facility] °[place],  °336-722-8721                ° °

## 2016-07-10 NOTE — ED Triage Notes (Signed)
Pt reports that he stopped drinking yesterday morning. Was seen at [facility] early this am for detox. Went home and reports having seizure activity witnessed by roommate.

## 2016-07-10 NOTE — ED Triage Notes (Signed)
Per EMS pt presents for resources to help w/ ETOH detox.

## 2016-07-10 NOTE — ED Notes (Signed)
Bed: [id number] Expected date:  Expected time:  Means of arrival:  Comments: EMS 35yo M ETOH withdrawal

## 2016-07-10 NOTE — H&P (Signed)
History and Physical    Thomas Blake [id number] DOB: Oct 04, 1981 DOA: 07/10/2016  PCP: No PCP Per Patient Patient coming from: home  Chief Complaint: seizure/etoh withdrawal  HPI: Thomas Blake is a 35 y.o. male with medical history significant for restless leg syndrome EtOH abuse cocaine use presents to the emergency Department chief complaint of EtOH withdrawal and possible seizure. Last drink reportedly 24 hours ago. Initial evaluation reveals acute kidney injury tachycardia. Concern for seizure.  Information is obtained from the patient and the chart. He reports going to [facility] yesterday seeking detox from alcohol. At that time he was not having DTs or signs symptoms of withdrawal. He is provided with a Librium taper and referral to outpatient detox. Roommate brought him back to [place] early this morning concern for seizure. Roommate reports patient had an episode where his "eyes glazed over and rolled back into his head". This episode lasts about 2 minutes. Associated symptoms include mild confusion once episode over, chills mild tremors and headache. He denies chest pain palpitation shortness of breath abdominal pain nausea vomiting dysuria hematuria frequency or urgency. He denies any diarrhea constipation melena or bright red blood per rectum.    ED Course: He is afebrile hemodynamically stable and not hypoxic. CIWA protocol initiated.  Review of Systems: As per HPI otherwise 10 point review of systems negative.   Ambulatory Status: Ambulates independently with steady gait  Past Medical History:  Diagnosis Date  . AKI (acute kidney injury) ([place])   . Cancer [facility]) 2009   testicular  . ETOH abuse   . Seizure ([place])   . Tobacco abuse     Past Surgical History:  Procedure Laterality Date  . ORCHIECTOMY  2009     Social History   Social History  . Marital status: Divorced    Spouse name: N/A  . Number of children: N/A  . Years of education: N/A   Occupational  History  . Not on file.   Social History Main Topics  . Smoking status: Current Every Day Smoker    Types: Cigarettes  . Smokeless tobacco: Never Used  . Alcohol use Yes     Comment: 1/2 gallon of vodka daily  . Drug use:     Types: Cocaine  . Sexual activity: Yes    Birth control/ protection: None   Other Topics Concern  . Not on file   Social History Narrative  . No narrative on file   He is currently employed on the night shift at [employer] Lives with a roommate. Allergies  Allergen Reactions  . Tramadol     Seizure?    Raquel Sarna medical history includes a father both alive. Both diabetic. Father's history positive for CAD, hypertension, stroke he has 4 siblings collective medical history negative for CAD cancer  Prior to Admission medications   Medication Sig Start Date End Date Taking? Authorizing Provider  rOPINIRole (REQUIP) 3 MG tablet Take 3 mg by mouth 3 (three) times daily.   Yes Historical Provider, MD  chlordiazePOXIDE (LIBRIUM) 25 MG capsule 50mg  PO TID x 1D, then 25-50mg  PO BID X 1D, then 25-50mg  PO QD X 1D Patient not taking: Reported on 07/10/2016 07/10/16   Varney Biles, MD    Physical Exam: Vitals:   07/10/16 1200 07/10/16 1215 07/10/16 1230 07/10/16 1245  BP: 124/81 124/98 113/90 115/73  Pulse: 98 76 81 71  Resp: 14 20 22 16   Temp:      TempSrc:  SpO2: 98% 96% 99% 99%     General:  Appears Only slightly anxious but not uncomfortable Eyes:  PERRL, EOMI, normal lids, iris ENT:  grossly normal hearing, lips & tongue, dismembered of his mouth are pink and dry Neck:  no LAD, masses or thyromegaly Cardiovascular:  RRR, no m/r/g. No LE edema.  Respiratory:  CTA bilaterally, no w/r/r. Normal respiratory effort. Abdomen:  soft, ntnd, positive bowel sounds somewhat sluggish no guarding or rebounding Skin:  no rash or induration seen on limited exam Musculoskeletal:  grossly normal tone BUE/BLE, good ROM, no bony abnormality, joints without  swelling/erythema Psychiatric:  grossly normal mood and affect, speech fluent and appropriate, AOx3 Neurologic:  CN 2-12 grossly intact, moves all extremities in coordinated fashion, sensation intact very mild tremors  Labs on Admission: I have personally reviewed following labs and imaging studies  CBC:  Recent Labs Lab 07/10/16 0235 07/10/16 1030  WBC 8.8 7.1  NEUTROABS  --  3.6  HGB 17.6* 16.4  HCT 49.6 48.4  MCV 88.7 91.3  PLT 300 0000000   Basic Metabolic Panel:  Recent Labs Lab 07/10/16 0235 07/10/16 1030  NA 141 138  K 3.9 3.9  CL 107 108  CO2 23 23  GLUCOSE 120* 101*  BUN 7 5*  CREATININE 1.35* 1.26*  CALCIUM 8.9 8.6*   GFR: Estimated Creatinine Clearance: 96.3 mL/min (by C-G formula based on SCr of 1.26 mg/dL). Liver Function Tests:  Recent Labs Lab 07/10/16 1030  AST 22  ALT 22  ALKPHOS 74  BILITOT 0.5  PROT 6.0*  ALBUMIN 3.3*   No results for input(s): LIPASE, AMYLASE in the last 168 hours. No results for input(s): AMMONIA in the last 168 hours. Coagulation Profile: No results for input(s): INR, PROTIME in the last 168 hours. Cardiac Enzymes: No results for input(s): CKTOTAL, CKMB, CKMBINDEX, TROPONINI in the last 168 hours. BNP (last 3 results) No results for input(s): PROBNP in the last 8760 hours. HbA1C: No results for input(s): HGBA1C in the last 72 hours. CBG: No results for input(s): GLUCAP in the last 168 hours. Lipid Profile: No results for input(s): CHOL, HDL, LDLCALC, TRIG, CHOLHDL, LDLDIRECT in the last 72 hours. Thyroid Function Tests: No results for input(s): TSH, T4TOTAL, FREET4, T3FREE, THYROIDAB in the last 72 hours. Anemia Panel: No results for input(s): VITAMINB12, FOLATE, FERRITIN, TIBC, IRON, RETICCTPCT in the last 72 hours. Urine analysis:    Component Value Date/Time   COLORURINE YELLOW 07/16/2010 2240   APPEARANCEUR CLOUDY (A) 07/16/2010 2240   LABSPEC 1.012 07/16/2010 2240   PHURINE 7.0 07/16/2010 2240   GLUCOSEU  NEGATIVE 07/16/2010 2240   HGBUR NEGATIVE 07/16/2010 2240   BILIRUBINUR NEGATIVE 07/16/2010 2240   KETONESUR NEGATIVE 07/16/2010 2240   PROTEINUR NEGATIVE 07/16/2010 2240   UROBILINOGEN 0.2 07/16/2010 2240   NITRITE NEGATIVE 07/16/2010 2240   LEUKOCYTESUR  07/16/2010 2240    NEGATIVE MICROSCOPIC NOT DONE ON URINES WITH NEGATIVE PROTEIN, BLOOD, LEUKOCYTES, NITRITE, OR GLUCOSE <1000 mg/dL.    Creatinine Clearance: Estimated Creatinine Clearance: 96.3 mL/min (by C-G formula based on SCr of 1.26 mg/dL).  Sepsis Labs: @LABRCNTIP (procalcitonin:4,lacticidven:4) )No results found for this or any previous visit (from the past 240 hour(s)).   Radiological Exams on Admission: Dg Chest 2 View  Result Date: 07/10/2016 CLINICAL DATA:  Left-sided chest pain for 1 hour. Vomiting earlier today. EXAM: CHEST  2 VIEW COMPARISON:  None. FINDINGS: The cardiomediastinal contours are normal. The lungs are clear. Pulmonary vasculature is normal. No consolidation, pleural effusion, or  pneumothorax. No acute osseous abnormalities are seen. IMPRESSION: No acute pulmonary process. Electronically Signed   By: Jeb Levering M.D.   On: 07/10/2016 02:37   Ct Head Wo Contrast  Result Date: 07/10/2016 CLINICAL DATA:  Headache. Possible seizure activity. The known fall or trauma. Initial encounter. EXAM: CT HEAD WITHOUT CONTRAST TECHNIQUE: Contiguous axial images were obtained from the base of the skull through the vertex without intravenous contrast. COMPARISON:  None. FINDINGS: There is no evidence of acute cortical infarct, intracranial hemorrhage, mass, midline shift, or extra-axial fluid collection. The ventricles and sulci are normal. Orbits are unremarkable. The frontal and maxillary sinuses are hypoplastic. There is no evidence of acute inflammatory change in the sinuses or mastoid air cells. No acute osseous abnormality is identified. IMPRESSION: Unremarkable CT appearance of the brain. Electronically Signed   By:  Logan Bores M.D.   On: 07/10/2016 11:05    EKG: Independently reviewed. SR  Assessment/Plan Active Problems:   Seizure (HCC)   ETOH abuse   Cancer (HCC)   AKI (acute kidney injury) ([place])   Tobacco abuse   Cocaine abuse   1. Acute kidney injury. Likely related to dehydration in setting of EtOH withdrawal. Creatinine 1.35 on admission. History of same. -Admit to medical floor -Vigorous IV fluids -Monitor urine output -Hold nephrotoxins -Recheck in the morning  #2. EtOH abuse/withdrawal. ETOH level 84 on admission.  Concern for seizure. Roommate reports seizure-like activity prior to presentation. Patient reports drinking half a gallon of vodka daily and has done so for the last 5 years. Last drink 24 hours ago. CT of the head unremarkable. Currently mild tremors -CIWA protocol. Currently only mild tremor -social work -EEG  #3. Tobacco use -Cessation counseling offered  4. Cocaine use. Reports having used cocaine 2 days ago. -Urine drug screen -See above -Social work consult  #5. History of testicular cancer. 2009. No current treatment   DVT prophylaxis: scd Code Status: full  Family Communication: none present  Disposition Plan: home hopefully 24 hours  Consults called: none  Admission status: obs    Dyanne Carrel M MD [facility]  If 7PM-7AM, please contact night-coverage [email or web address] Password [email or web address]  07/10/2016, 1:04 PM

## 2016-07-10 NOTE — ED Notes (Signed)
Pt. Reports every day drinker of approx 1/2 gallon of vodka. Pt. Reports last drink yesterday AM. Reports no previous attempts or history of withdrawal. Pt. Seen at [facility] this AM, states he does not remember getting home. Per. Pt. Roommate questionable seizure activity. Pt. AxO x4, states he does not remember event.

## 2016-07-11 ENCOUNTER — Observation Stay (HOSPITAL_COMMUNITY): Payer: Self-pay

## 2016-07-11 DIAGNOSIS — F1010 Alcohol abuse, uncomplicated: Secondary | ICD-10-CM

## 2016-07-11 DIAGNOSIS — N179 Acute kidney failure, unspecified: Principal | ICD-10-CM

## 2016-07-11 DIAGNOSIS — R569 Unspecified convulsions: Secondary | ICD-10-CM

## 2016-07-11 LAB — BASIC METABOLIC PANEL
Anion gap: 6 (ref 5–15)
BUN: 7 mg/dL (ref 6–20)
CALCIUM: 8.3 mg/dL — AB (ref 8.9–10.3)
CHLORIDE: 107 mmol/L (ref 101–111)
CO2: 21 mmol/L — AB (ref 22–32)
CREATININE: 1.21 mg/dL (ref 0.61–1.24)
GFR calc non Af Amer: >60 mL/min (ref >60)
Glucose, Bld: 92 mg/dL (ref 65–99)
Potassium: 4.1 mmol/L (ref 3.5–5.1)
SODIUM: 134 mmol/L — AB (ref 135–145)

## 2016-07-11 LAB — CBC
HCT: 45.5 % (ref 39.0–52.0)
Hemoglobin: 15 g/dL (ref 13.0–17.0)
MCH: 30.3 pg (ref 26.0–34.0)
MCHC: 33 g/dL (ref 30.0–36.0)
MCV: 91.9 fL (ref 78.0–100.0)
PLATELETS: 259 10*3/uL (ref 150–400)
RBC: 4.95 MIL/uL (ref 4.22–5.81)
RDW: 13 % (ref 11.5–15.5)
WBC: 8 10*3/uL (ref 4.0–10.5)

## 2016-07-11 MED ORDER — KETOROLAC TROMETHAMINE 30 MG/ML IJ SOLN
30.0000 mg | Freq: Once | INTRAMUSCULAR | Status: AC
  Administered 2016-07-11: 30 mg via INTRAVENOUS
  Filled 2016-07-11: qty 1

## 2016-07-11 MED ORDER — CHLORDIAZEPOXIDE HCL 25 MG PO CAPS
50.0000 mg | ORAL_CAPSULE | Freq: Once | ORAL | Status: AC
  Administered 2016-07-11: 50 mg via ORAL
  Filled 2016-07-11: qty 2

## 2016-07-11 MED ORDER — METOCLOPRAMIDE HCL 5 MG/ML IJ SOLN
10.0000 mg | Freq: Once | INTRAMUSCULAR | Status: AC
  Administered 2016-07-11: 10 mg via INTRAVENOUS
  Filled 2016-07-11: qty 2

## 2016-07-11 MED ORDER — LORAZEPAM 2 MG/ML IJ SOLN
2.0000 mg | INTRAMUSCULAR | Status: DC
  Administered 2016-07-11 – 2016-07-12 (×2): 2 mg via INTRAVENOUS
  Filled 2016-07-11 (×2): qty 1

## 2016-07-11 MED ORDER — NICOTINE 21 MG/24HR TD PT24: 21.0000 mg | MEDICATED_PATCH | Freq: Every day | TRANSDERMAL | Status: DC | PRN

## 2016-07-11 MED ORDER — DIPHENHYDRAMINE HCL 50 MG/ML IJ SOLN
25.0000 mg | Freq: Once | INTRAMUSCULAR | Status: AC
  Administered 2016-07-11: 25 mg via INTRAVENOUS
  Filled 2016-07-11: qty 1

## 2016-07-11 MED ORDER — LORAZEPAM 2 MG/ML IJ SOLN
2.0000 mg | Freq: Once | INTRAMUSCULAR | Status: DC
  Filled 2016-07-11: qty 1

## 2016-07-11 MED ORDER — CHLORDIAZEPOXIDE HCL 25 MG PO CAPS
25.0000 mg | ORAL_CAPSULE | Freq: Once | ORAL | Status: AC
  Administered 2016-07-11: 25 mg via ORAL
  Filled 2016-07-11: qty 1

## 2016-07-11 NOTE — Procedures (Signed)
ELECTROENCEPHALOGRAM REPORT  Date of Study: 07/11/2016  Patient's Name: Thomas Blake MRN: [id number] Date of Birth: 01-Sep-1981  Referring Provider: Dyanne Carrel, NP  Clinical History: 35 y.o.malewith medical history significant for restless leg syndrome EtOH abuse cocaine use presents to the emergency Department chief complaint of EtOH withdrawal and possible seizure  Medications: acetaminophen (TYLENOL) suppository 650 mg   L1 acetaminophen (TYLENOL) tablet 650 mg   enoxaparin (LOVENOX) injection 40 mg   folic acid (FOLVITE) tablet 1 mg  L2 LORazepam (ATIVAN) injection 1 mg  L2 LORazepam (ATIVAN) tablet 1 mg   multivitamin with minerals tablet 1 tablet   nicotine (NICODERM CQ - dosed in mg/24 hours) patch 21 mg  L3 ondansetron (ZOFRAN) injection 4 mg  L3 ondansetron (ZOFRAN) tablet 4 mg   rOPINIRole (REQUIP) tablet 2 mg  L4 thiamine (B-1) injection 100 mg  L4 thiamine (VITAMIN B-1) tablet 100 mg  Outpatient Medications  rOPINIRole (REQUIP) 3 MG tablet   chlordiazePOXIDE (LIBRIUM) 25 MG capsule  Technical Summary: A multichannel digital EEG recording measured by the international 10-20 system with electrodes applied with paste and impedances below 5000 ohms performed in our laboratory with EKG monitoring in an awake and asleep patient.  Photic stimulation was performed.  The digital EEG was referentially recorded, reformatted, and digitally filtered in a variety of bipolar and referential montages for optimal display.    Description: The patient is awake and asleep during the recording.  During maximal wakefulness, there is a symmetric, medium voltage 9 Hz posterior dominant rhythm that attenuates with eye opening.  The record is symmetric.  During drowsiness and sleep, there is an increase in theta slowing of the background.  Vertex waves and symmetric sleep spindles were seen.  Hyperventilation was not performed.  Photic stimulation did not elicit any abnormalities.  There were  no epileptiform discharges or electrographic seizures seen.    EKG lead was unremarkable.  Impression: This awake and asleep EEG is normal.    Clinical Correlation: A normal EEG does not exclude a clinical diagnosis of epilepsy.  If further clinical questions remain, prolonged EEG may be helpful.  Clinical correlation is advised.   Metta Clines, DO

## 2016-07-11 NOTE — Progress Notes (Signed)
CSW received consult substance/ETOH abuse. CSW completed SBIRT with patient. Patient stated he knows he has a problem with alcohol and knows it is damaging to his health. CSW provided resources. He stated he would like to lessen his drinking and appreciated the resources. He has heard of the [facility] and is interested in finding out more about the facility.  CSW signing off.  Thomas Blake LCSWA [phone]

## 2016-07-11 NOTE — Progress Notes (Signed)
EEG Completed; Results Pending  

## 2016-07-11 NOTE — Progress Notes (Signed)
Nutrition Brief Note  Patient identified on the Malnutrition Screening Tool (MST) Report  Wt Readings from Last 15 Encounters:  07/10/16 171 lb 11.2 oz (77.9 kg)  07/10/16 205 lb (93 kg)  05/25/16 206 lb (93.4 kg)   35 y.o. male with medical history significant for restless leg syndrome EtOH abuse cocaine use presents to the emergency Department chief complaint of EtOH withdrawal and possible seizure. Last drink reportedly 24 hours ago. Initial evaluation reveals acute kidney injury tachycardia. Concern for seizure.  Pt sleeping soundly at time of visit. Pt did not arise when name was called. No fat or muscle depletion observed.  Reviewed wt hx. Noted 36# discrepancy with wt on 07/10/16. Suspect wt of 171# is an outlier.   Body mass index is 23.95 kg/m. Patient meets criteria for normal weight range based on current BMI.   Current diet order is regular, patient is consuming approximately 100% of meals at this time. Labs and medications reviewed.   No nutrition interventions warranted at this time. If nutrition issues arise, please consult RD.   Thomas Blake A. Jimmye Norman, RD, LDN, CDE Pager: [phone] After hours Pager: [phone]

## 2016-07-11 NOTE — Progress Notes (Addendum)
   07/11/16 1830  What Happened  Was fall witnessed? No  Was patient injured? Yes  Patient found other (Comment) (sitting in bed)  Found by No one-pt stated they fell  Stated prior activity other (comment) (leaning to get a cup of water & fell forward)  Follow Up  MD notified Dr. Maudie Mercury  Time MD notified [phone]  Family notified No- patient refusal  Time family notified (n/a)  Additional tests No  Simple treatment Ice  Progress note created (see row info) Yes  Adult Fall Risk Assessment  Risk Factor Category (scoring not indicated) High fall risk per protocol (document High fall risk)  Patient's Fall Risk High Fall Risk (>13 points)  Adult Fall Risk Interventions  Required Bundle Interventions *See Row Information* High fall risk - low, moderate, and high requirements implemented  Additional Interventions Room near nurses station  Screening for Fall Injury Risk  Risk For Fall Injury- See Row Information  Nurse judgement  Neurological  Neuro (WDL) WDL  Level of Consciousness Alert  Orientation Level Oriented X4  Cognition Appropriate at baseline;Appropriate attention/concentration  Speech Clear;Appropriate for developmental age;Appropriate at baseline  Pupil Assessment  Yes  R Pupil Size (mm) 3  R Pupil Shape Round  R Pupil Reaction Brisk  L Pupil Size (mm) 3  L Pupil Shape Round  L Pupil Reaction Brisk  Additional Pupil Assessments Yes  R Extraocular Movement 4  L Extraocular Movement 4  R Hand Grip Present  L Hand Grip Present  RUE Motor Response Purposeful movement  RUE Motor Strength 5  LUE Motor Response Purposeful movement  LUE Motor Strength 5  RLE Motor Response Purposeful movement  RLE Motor Strength 5  LLE Motor Response Purposeful movement  LLE Motor Strength 5  Neuro Symptoms None   Patient being monitored with frequent vitals and neuro checks. Has some swelling to R eye, ice applied and is helping. Dr. Maudie Mercury aware, will continue to monitor.

## 2016-07-11 NOTE — Progress Notes (Signed)
Patient ID: Thomas Blake, male   DOB: November 02, 1981, 35 y.o.   MRN: [id number]                                                                PROGRESS NOTE                                                                                                                                                                                                             Patient Demographics:    Thomas Blake, is a 35 y.o. male, DOB - [birth year], [id number]  Admit date - 07/10/2016   Admitting Physician Courage Denton Brick, MD  Outpatient Primary MD for the patient is No PCP Per Patient  LOS - 0  Outpatient Specialists: none  Chief Complaint  Patient presents with  . Seizures  . Alcohol Problem       Brief Narrative  35 y.o. male with medical history significant for restless leg syndrome EtOH abuse cocaine use presents to the emergency Department chief complaint of EtOH withdrawal and possible seizure. Last drink reportedly 24 hours ago. Initial evaluation reveals acute kidney injury tachycardia. Concern for seizure.  Information is obtained from the patient and the chart. He reports going to [facility] yesterday seeking detox from alcohol. At that time he was not having DTs or signs symptoms of withdrawal. He is provided with a Librium taper and referral to outpatient detox. Roommate brought him back to [facility] early this morning concern for seizure. Roommate reports patient had an episode where his "eyes glazed over and rolled back into his head". This episode lasts about 2 minutes. Associated symptoms include mild confusion once episode over, chills mild tremors and headache. He denies chest pain palpitation shortness of breath abdominal pain nausea vomiting dysuria hematuria frequency or urgency. He denies any diarrhea constipation melena or bright red blood per rectum.   Subjective:    Thomas Blake today has feeling of shakiness, no further seizure.  No headache, No chest pain, No abdominal pain -  No Nausea, No new weakness tingling or numbness, No Cough - SOB.    Assessment  & Plan :    Active Problems:   Seizure ([place])   ETOH abuse   Cancer ([place])   AKI (acute kidney injury) ([place])   Tobacco abuse  Cocaine abuse  1. Acute kidney injury. Likely related to dehydration in setting of EtOH withdrawal. Creatinine 1.35 on admission. History of same. -Admit to medical floor - Ns finished -Monitor urine output -Hold nephrotoxins -cbc cmp tomorrow am  #2. EtOH abuse/withdrawal. ETOH level 84 on admission.  Concern for seizure. Roommate reports seizure-like activity prior to presentation. Patient reports drinking half a gallon of vodka daily and has done so for the last 5 years. Last drink 24 hours ago. CT of the head unremarkable. Currently mild tremors -CIWA protocol. Currently only mild tremor -social work -EEG pending Librium 50mg  po x1 today.   #3. Tobacco use -Cessation counseling offered  4. Cocaine use. Reports having used cocaine 2 days ago. -Urine drug screen -See above -Social work consult  #5. History of testicular cancer. 2009. No current treatment    Code Status : FULL CODE  Family Communication  :   Disposition Plan  : home  Barriers For Discharge :   Consults  :  none  Procedures  : EEG pending  DVT Prophylaxis  :  Lovenox - SCDs   Lab Results  Component Value Date   PLT 259 07/11/2016    Antibiotics  :    Anti-infectives    None        Objective:   Vitals:   07/10/16 1830 07/10/16 2017 07/11/16 0003 07/11/16 0627  BP: (!) 106/38 118/70 128/77 117/77  Pulse: 88 77 66 65  Resp: 18 18 18 18   Temp: 98.8 F (37.1 C) 99.2 F (37.3 C) 98.1 F (36.7 C) 98.2 F (36.8 C)  TempSrc: Oral Oral Oral Oral  SpO2: 99% 96% 97% 98%  Weight:      Height:        Wt Readings from Last 3 Encounters:  07/10/16 77.9 kg (171 lb 11.2 oz)  07/10/16 93 kg (205 lb)  05/25/16 93.4 kg (206 lb)     Intake/Output Summary (Last 24 hours) at  07/11/16 0853 Last data filed at 07/11/16 0612  Gross per 24 hour  Intake          1776.25 ml  Output             1200 ml  Net           576.25 ml     Physical Exam  Awake Alert, Oriented X 3, No new F.N deficits, Normal affect [place].AT,PERRAL Supple Neck,No JVD, No cervical lymphadenopathy appriciated.  Symmetrical Chest wall movement, Good air movement bilaterally, CTAB RRR,No Gallops,Rubs or new Murmurs, No Parasternal Heave +ve B.Sounds, Abd Soft, No tenderness, No organomegaly appriciated, No rebound - guarding or rigidity. No Cyanosis, Clubbing or edema, No new Rash or bruise      Data Review:    CBC  Recent Labs Lab 07/10/16 0235 07/10/16 1030 07/11/16 0520  WBC 8.8 7.1 8.0  HGB 17.6* 16.4 15.0  HCT 49.6 48.4 45.5  PLT 300 258 259  MCV 88.7 91.3 91.9  MCH 31.5 30.9 30.3  MCHC 35.5 33.9 33.0  RDW 13.0 13.1 13.0  LYMPHSABS  --  2.0  --   MONOABS  --  0.4  --   EOSABS  --  1.0*  --   BASOSABS  --  0.0  --     Chemistries   Recent Labs Lab 07/10/16 0235 07/10/16 1030 07/11/16 0520  NA 141 138 134*  K 3.9 3.9 4.1  CL 107 108 107  CO2 23 23 21*  GLUCOSE 120* 101* 92  BUN 7 5* 7  CREATININE 1.35* 1.26* 1.21  CALCIUM 8.9 8.6* 8.3*  AST  --  22  --   ALT  --  22  --   ALKPHOS  --  74  --   BILITOT  --  0.5  --    ------------------------------------------------------------------------------------------------------------------ No results for input(s): CHOL, HDL, LDLCALC, TRIG, CHOLHDL, LDLDIRECT in the last 72 hours.  No results found for: HGBA1C ------------------------------------------------------------------------------------------------------------------  Recent Labs  07/10/16 1356  TSH 0.974   ------------------------------------------------------------------------------------------------------------------ No results for input(s): VITAMINB12, FOLATE, FERRITIN, TIBC, IRON, RETICCTPCT in the last 72 hours.  Coagulation profile No results  for input(s): INR, PROTIME in the last 168 hours.  No results for input(s): DDIMER in the last 72 hours.  Cardiac Enzymes No results for input(s): CKMB, TROPONINI, MYOGLOBIN in the last 168 hours.  Invalid input(s): CK ------------------------------------------------------------------------------------------------------------------ No results found for: BNP  Inpatient Medications  Scheduled Meds: . chlordiazePOXIDE  50 mg Oral Once  . enoxaparin (LOVENOX) injection  40 mg Subcutaneous Q24H  . folic acid  1 mg Oral Daily  . multivitamin with minerals  1 tablet Oral Daily  . rOPINIRole  2 mg Oral TID  . thiamine  100 mg Oral Daily   Or  . thiamine  100 mg Intravenous Daily   Continuous Infusions:  PRN Meds:.acetaminophen **OR** acetaminophen, LORazepam **OR** LORazepam, ondansetron **OR** ondansetron (ZOFRAN) IV  Micro Results No results found for this or any previous visit (from the past 240 hour(s)).  Radiology Reports Dg Chest 2 View  Result Date: 07/10/2016 CLINICAL DATA:  Left-sided chest pain for 1 hour. Vomiting earlier today. EXAM: CHEST  2 VIEW COMPARISON:  None. FINDINGS: The cardiomediastinal contours are normal. The lungs are clear. Pulmonary vasculature is normal. No consolidation, pleural effusion, or pneumothorax. No acute osseous abnormalities are seen. IMPRESSION: No acute pulmonary process. Electronically Signed   By: Jeb Levering M.D.   On: 07/10/2016 02:37   Ct Head Wo Contrast  Result Date: 07/10/2016 CLINICAL DATA:  Headache. Possible seizure activity. The known fall or trauma. Initial encounter. EXAM: CT HEAD WITHOUT CONTRAST TECHNIQUE: Contiguous axial images were obtained from the base of the skull through the vertex without intravenous contrast. COMPARISON:  None. FINDINGS: There is no evidence of acute cortical infarct, intracranial hemorrhage, mass, midline shift, or extra-axial fluid collection. The ventricles and sulci are normal. Orbits are  unremarkable. The frontal and maxillary sinuses are hypoplastic. There is no evidence of acute inflammatory change in the sinuses or mastoid air cells. No acute osseous abnormality is identified. IMPRESSION: Unremarkable CT appearance of the brain. Electronically Signed   By: Logan Bores M.D.   On: 07/10/2016 11:05    Time Spent in minutes  30   Jani Gravel M.D on 07/11/2016 at 8:53 AM  Between 7am to 7pm - Pager - [phone]  After 7pm go to [email or web address] - password [facility]  [facility] -  Office  [phone]

## 2016-07-12 LAB — CBC
HCT: 47 % (ref 39.0–52.0)
HEMOGLOBIN: 15.5 g/dL (ref 13.0–17.0)
MCH: 30.3 pg (ref 26.0–34.0)
MCHC: 33 g/dL (ref 30.0–36.0)
MCV: 91.8 fL (ref 78.0–100.0)
Platelets: 204 10*3/uL (ref 150–400)
RBC: 5.12 MIL/uL (ref 4.22–5.81)
RDW: 12.7 % (ref 11.5–15.5)
WBC: 7.8 10*3/uL (ref 4.0–10.5)

## 2016-07-12 LAB — COMPREHENSIVE METABOLIC PANEL
ALK PHOS: 67 U/L (ref 38–126)
ALT: 20 U/L (ref 17–63)
ANION GAP: 5 (ref 5–15)
AST: 19 U/L (ref 15–41)
Albumin: 3 g/dL — ABNORMAL LOW (ref 3.5–5.0)
BUN: 7 mg/dL (ref 6–20)
CALCIUM: 8.4 mg/dL — AB (ref 8.9–10.3)
CO2: 22 mmol/L (ref 22–32)
Chloride: 109 mmol/L (ref 101–111)
Creatinine, Ser: 1.13 mg/dL (ref 0.61–1.24)
Glucose, Bld: 88 mg/dL (ref 65–99)
Potassium: 3.7 mmol/L (ref 3.5–5.1)
SODIUM: 136 mmol/L (ref 135–145)
TOTAL PROTEIN: 5.2 g/dL — AB (ref 6.5–8.1)
Total Bilirubin: 0.4 mg/dL (ref 0.3–1.2)

## 2016-07-12 MED ORDER — THIAMINE HCL 100 MG PO TABS: 100.0000 mg | ORAL_TABLET | Freq: Every day | ORAL | 0 refills | Status: AC

## 2016-07-12 MED ORDER — FOLIC ACID 1 MG PO TABS: 1.0000 mg | ORAL_TABLET | Freq: Every day | ORAL | 0 refills | Status: AC

## 2016-07-12 MED ORDER — CHLORDIAZEPOXIDE HCL 25 MG PO CAPS: 25.0000 mg | ORAL_CAPSULE | Freq: Once | ORAL | Status: DC

## 2016-07-12 MED ORDER — CHLORDIAZEPOXIDE HCL 25 MG PO CAPS
25.0000 mg | ORAL_CAPSULE | Freq: Once | ORAL | Status: AC
  Administered 2016-07-12: 25 mg via ORAL
  Filled 2016-07-12: qty 1

## 2016-07-12 NOTE — Discharge Summary (Signed)
Thomas Blake, is a 35 y.o. male  DOB 24-Oct-1981  MRN [id number].  Admission date:  07/10/2016  Admitting Physician  Roxan Hockey, MD  Discharge Date:  07/12/2016   Primary MD  No PCP Per Patient  Recommendations for primary care physician for things to follow:   Alcohol abuse w withdrawal Pt counselled on cessation Pt received librium 3rd dose prior to discharge.   Seizure  Most likely due to etoh  EEG 07/11/2016 no seizure activity  ARF secondary to dehydration Resolved with hydration.   Coccaine abuse Please stop,  Pt counselled to seek an outpatient program for polysubstance abuse  Admission Diagnosis  Alcohol abuse [F10.10] Seizure ([place]) [R56.9] Cancer ([place]) [C80.1] ETOH abuse [F10.10]   Discharge Diagnosis  Alcohol abuse [F10.10] Seizure ([place]) [R56.9] Cancer ([place]) [C80.1] ETOH abuse [F10.10]    Active Problems:   Seizure ([place])   ETOH abuse   Cancer ([place])   AKI (acute kidney injury) ([place])   Tobacco abuse   Cocaine abuse      Past Medical History:  Diagnosis Date  . AKI (acute kidney injury) ([place])   . 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 ETOH abuse   . Seizure ([place])   . Tobacco abuse     Past Surgical History:  Procedure Laterality Date  . ORCHIECTOMY  2009       HPI  from the history and physical done on the day of admission:     35 y.o. male with medical history significant for restless leg syndrome EtOH abuse cocaine use presents to the emergency Department chief complaint of EtOH withdrawal and possible seizure. Last drink reportedly 24 hours ago. Initial evaluation reveals acute kidney injury tachycardia. Concern for seizure.  Information is obtained from the patient and the chart. He reports going to [facility] yesterday seeking detox from alcohol. At that time he was not having DTs or signs symptoms of withdrawal. He is provided with a Librium taper and  referral to outpatient detox. Roommate brought him back to [place] early this morning concern for seizure. Roommate reports patient had an episode where his "eyes glazed over and rolled back into his head". This episode lasts about 2 minutes. Associated symptoms include mild confusion once episode over, chills mild tremors and headache. He denies chest pain palpitation shortness of breath abdominal pain nausea vomiting dysuria hematuria frequency or urgency. He denies any diarrhea constipation melena or bright red blood per rectum.      Hospital Course:        Pt admitted and hydrated with normal saline. ARF resolved. Pt had mild withdrawal from alcohol last nite.  Doing better this am.  VSS,  Afebrile.  Pt stable and requesting to go home.  Social worker provided him with outpatient detox names.    Follow UP  Follow-up Information    [place] Follow up on 07/15/2016.   Why:  Post hospital follow up scheduled on 07/15/2016 at 11am with Levada Dy  McClough NP. Contact information: [address] [place] [phone]           Consults obtained - none  Discharge Condition: stable  Diet and Activity recommendation: See Discharge Instructions below  Discharge Instructions         Discharge Medications       Medication List    STOP taking these medications   chlordiazePOXIDE 25 MG capsule Commonly known as:  LIBRIUM     TAKE these medications   folic acid 1 MG tablet Commonly known as:  FOLVITE Take 1 tablet (1 mg total) by mouth daily.   rOPINIRole 3 MG tablet Commonly known as:  REQUIP Take 3 mg by mouth 3 (three) times daily.   thiamine 100 MG tablet Take 1 tablet (100 mg total) by mouth daily.       Major procedures and Radiology Reports - PLEASE review detailed and final reports for all details, in brief -      Dg Chest 2 View  Result Date: 07/10/2016 CLINICAL DATA:  Left-sided chest  pain for 1 hour. Vomiting earlier today. EXAM: CHEST  2 VIEW COMPARISON:  None. FINDINGS: The cardiomediastinal contours are normal. The lungs are clear. Pulmonary vasculature is normal. No consolidation, pleural effusion, or pneumothorax. No acute osseous abnormalities are seen. IMPRESSION: No acute pulmonary process. Electronically Signed   By: Jeb Levering M.D.   On: 07/10/2016 02:37   Ct Head Wo Contrast  Result Date: 07/10/2016 CLINICAL DATA:  Headache. Possible seizure activity. The known fall or trauma. Initial encounter. EXAM: CT HEAD WITHOUT CONTRAST TECHNIQUE: Contiguous axial images were obtained from the base of the skull through the vertex without intravenous contrast. COMPARISON:  None. FINDINGS: There is no evidence of acute cortical infarct, intracranial hemorrhage, mass, midline shift, or extra-axial fluid collection. The ventricles and sulci are normal. Orbits are unremarkable. The frontal and maxillary sinuses are hypoplastic. There is no evidence of acute inflammatory change in the sinuses or mastoid air cells. No acute osseous abnormality is identified. IMPRESSION: Unremarkable CT appearance of the brain. Electronically Signed   By: Logan Bores M.D.   On: 07/10/2016 11:05    Micro Results     No results found for this or any previous visit (from the past 240 hour(s)).     Today   Subjective    Thomas Blake today has no headache,no chest abdominal pain,no new weakness tingling or numbness, feels much better wants to go home today.    Objective   Blood pressure 101/76, pulse 62, temperature 98.2 F (36.8 C), temperature source Oral, resp. rate 17, height 5\' 11"  (1.803 m), weight 77.9 kg (171 lb 11.2 oz), SpO2 100 %.  No intake or output data in the 24 hours ending 07/12/16 1048  Exam Awake Alert, Oriented x 3, No new F.N deficits, Normal affect [place].AT,PERRAL Supple Neck,No JVD, No cervical lymphadenopathy appriciated.  Symmetrical Chest wall movement, Good air  movement bilaterally, CTAB RRR,No Gallops,Rubs or new Murmurs, No Parasternal Heave +ve B.Sounds, Abd Soft, Non tender, No organomegaly appriciated, No rebound -guarding or rigidity. No Cyanosis, Clubbing or edema, No new Rash or bruise   Data Review   CBC w Diff:  Lab Results  Component Value Date   WBC 7.8 07/12/2016   HGB 15.5 07/12/2016   HCT 47.0 07/12/2016   PLT 204 07/12/2016   LYMPHOPCT 28 07/10/2016   MONOPCT 6 07/10/2016   EOSPCT 14 07/10/2016   BASOPCT 1 07/10/2016  CMP:  Lab Results  Component Value Date   NA 136 07/12/2016   K 3.7 07/12/2016   CL 109 07/12/2016   CO2 22 07/12/2016   BUN 7 07/12/2016   CREATININE 1.13 07/12/2016   PROT 5.2 (L) 07/12/2016   ALBUMIN 3.0 (L) 07/12/2016   BILITOT 0.4 07/12/2016   ALKPHOS 67 07/12/2016   AST 19 07/12/2016   ALT 20 07/12/2016  .   Total Time in preparing paper work, data evaluation and todays exam - 36 minutes  Jani Gravel M.D on 07/12/2016 at 10:48 AM  [facility]   Office  [phone]

## 2016-07-12 NOTE — Discharge Instructions (Signed)
Alcohol Abuse and Nutrition °Alcohol abuse is any pattern of alcohol consumption that harms your health, relationships, or work. Alcohol abuse can affect how your body breaks down and absorbs nutrients from food by causing your liver to work abnormally. Additionally, many people who abuse alcohol do not eat enough carbohydrates, protein, fat, vitamins, and minerals. This can cause poor nutrition (malnutrition) and a lack of nutrients (nutrient deficiencies), which can lead to further complications. °Nutrients that are commonly lacking (deficient) among people who abuse alcohol include: °· Vitamins. °· Vitamin A. This is stored in your liver. It is important for your vision, metabolism, and ability to fight off infections (immunity). °· B vitamins. These include vitamins such as folate, thiamin, and niacin. These are important in new cell growth and maintenance. °· Vitamin C. This plays an important role in iron absorption, wound healing, and immunity. °· Vitamin D. This is produced by your liver, but you can also get vitamin D from food. Vitamin D is necessary for your body to absorb and use calcium. °· Minerals. °· Calcium. This is important for your bones and your heart and blood vessel (cardiovascular) function. °· Iron. This is important for blood, muscle, and nervous system functioning. °· Magnesium. This plays an important role in muscle and nerve function, and it helps to control blood sugar and blood pressure. °· Zinc. This is important for the normal function of your nervous system and digestive system (gastrointestinal tract). °Nutrition is an essential component of therapy for alcohol abuse. Your health care provider or dietitian will work with you to design a plan that can help restore nutrients to your body and prevent potential complications. °WHAT IS MY PLAN? °Your dietitian may develop a specific diet plan that is based on your condition and any other complications you may have. A diet plan will  commonly include: °· A balanced diet. °¨ Grains: 6-8 oz per day. °¨ Vegetables: 2-3 cups per day. °¨ Fruits: 1-2 cups per day. °¨ Meat and other protein: 5-6 oz per day. °¨ Dairy: 2-3 cups per day. °· Vitamin and mineral supplements. °WHAT DO I NEED TO KNOW ABOUT ALCOHOL AND NUTRITION? °· Consume foods that are high in antioxidants, such as grapes, berries, nuts, green tea, and dark green and orange vegetables. This can help to counteract some of the stress that is placed on your liver by consuming alcohol. °· Avoid food and drinks that are high in fat and sugar. Foods such as sugared soft drinks, salty snack foods, and candy contain empty calories. This means that they lack important nutrients such as protein, fiber, and vitamins. °· Eat frequent meals and snacks. Try to eat 5-6 small meals each day. °· Eat a variety of fresh fruits and vegetables each day. This will help you get plenty of water, fiber, and vitamins in your diet. °· Drink plenty of water and other clear fluids. Try to drink at least 48-64 oz (1.5-2 L) of water per day. °· If you are a vegetarian, eat a variety of protein-rich foods. Pair whole grains with plant-based proteins at meals and snacks to obtain the greatest nutrient benefit from your food. For example, eat rice with beans, put peanut butter on whole-grain toast, or eat oatmeal with sunflower seeds. °· Soak beans and whole grains overnight before cooking. This can help your body to absorb the nutrients more easily. °· Include foods fortified with vitamins and minerals in your diet. Commonly fortified foods include milk, orange juice, cereal, and bread. °· If you   are malnourished, your dietitian may recommend a high-protein, high-calorie diet. This may include: °· 2,000-3,000 calories (kilocalories) per day. °· 70-100 grams of protein per day. °· Your health care provider may recommend a complete nutritional supplement beverage. This can help to restore calories, protein, and vitamins to  your body. Depending on your condition, you may be advised to consume this instead of or in addition to meals. °· Limit your intake of caffeine. Replace drinks like coffee and black tea with decaffeinated coffee and herbal tea. °· Eat a variety of foods that are high in omega fatty acids. These include fish, nuts and seeds, and soybeans. These foods may help your liver to recover and may also stabilize your mood. °· Certain medicines may cause changes in your appetite, taste, and weight. Work with your health care provider and dietitian to make any adjustments to your medicines and diet plan. °· Include other healthy lifestyle choices in your daily routine. °· Be physically active. °· Get enough sleep. °· Spend time doing activities that you enjoy. °· If you are unable to take in enough food and calories by mouth, your health care provider may recommend a feeding tube. This is a tube that passes through your nose and throat, directly into your stomach. Nutritional supplement beverages can be given to you through the feeding tube to help you get the nutrients you need. °· Take vitamin or mineral supplements as recommended by your health care provider. °WHAT FOODS CAN I EAT? °Grains °Enriched pasta. Enriched rice. Fortified whole-grain bread. Fortified whole-grain cereal. Barley. Brown rice. Quinoa. Millet. °Vegetables °All fresh, frozen, and canned vegetables. Spinach. Kale. Artichoke. Carrots. Winter squash and pumpkin. Sweet potatoes. Broccoli. Cabbage. Cucumbers. Tomatoes. Sweet peppers. Green beans. Peas. Corn. °Fruits °All fresh and frozen fruits. Berries. Grapes. Mango. Papaya. Guava. Cherries. Apples. Bananas. Peaches. Plums. Pineapple. Watermelon. Cantaloupe. Oranges. Avocado. °Meats and Other Protein Sources °Beef liver. Lean beef. Pork. Fresh and canned chicken. Fresh fish. Oysters. Sardines. Canned tuna. Shrimp. Eggs with yolks. Nuts and seeds. Peanut butter. Beans and lentils. Soybeans.  Tofu. °Dairy °Whole, low-fat, and nonfat milk. Whole, low-fat, and nonfat yogurt. Cottage cheese. Sour cream. Hard and soft cheeses. °Beverages °Water. Herbal tea. Decaffeinated coffee. Decaffeinated green tea. 100% fruit juice. 100% vegetable juice. Instant breakfast shakes. °Condiments °Ketchup. Mayonnaise. Mustard. Salad dressing. Barbecue sauce. °Sweets and Desserts °Sugar-free ice cream. Sugar-free pudding. Sugar-free gelatin. °Fats and Oils °Butter. Vegetable oil, flaxseed oil, olive oil, and walnut oil. °Other °Complete nutrition shakes. Protein bars. Sugar-free gum. °The items listed above may not be a complete list of recommended foods or beverages. Contact your dietitian for more options. °WHAT FOODS ARE NOT RECOMMENDED? °Grains °Sugar-sweetened breakfast cereals. Flavored instant oatmeal. Fried breads. °Vegetables °Breaded or deep-fried vegetables. °Fruits °Dried fruit with added sugar. Candied fruit. Canned fruit in syrup. °Meats and Other Protein Sources °Breaded or deep-fried meats. °Dairy °Flavored milks. Fried cheese curds or fried cheese sticks. °Beverages °Alcohol. Sugar-sweetened soft drinks. Sugar-sweetened tea. Caffeinated coffee and tea. °Condiments °Sugar. Honey. Agave nectar. Molasses. °Sweets and Desserts °Chocolate. Cake. Cookies. Candy. °Other °Potato chips. Pretzels. Salted nuts. Candied nuts. °The items listed above may not be a complete list of foods and beverages to avoid. Contact your dietitian for more information. °  °This information is not intended to replace advice given to you by your health care provider. Make sure you discuss any questions you have with your health care provider. °  °Document Released: 09/15/2005 Document Revised: 12/12/2014 Document Reviewed: 06/24/2014 °Elsevier Interactive Patient   Education ©2016 [employer] ° °Alcohol Use Disorder °Alcohol use disorder is a mental disorder. It is not a one-time incident of heavy drinking. Alcohol use disorder is the  excessive and uncontrollable use of alcohol over time that leads to problems with functioning in one or more areas of daily living. People with this disorder risk harming themselves and others when they drink to excess. Alcohol use disorder also can cause other mental disorders, such as mood and anxiety disorders, and serious physical problems. People with alcohol use disorder often misuse other drugs.  °Alcohol use disorder is common and widespread. Some people with this disorder drink alcohol to cope with or escape from negative life events. Others drink to relieve chronic pain or symptoms of mental illness. People with a family history of alcohol use disorder are at higher risk of losing control and using alcohol to excess.  °Drinking too much alcohol can cause injury, accidents, and health problems. One drink can be too much when you are: °· Working. °· Pregnant or breastfeeding. °· Taking medicines. Ask your doctor. °· Driving or planning to drive. °SYMPTOMS  °Signs and symptoms of alcohol use disorder may include the following:  °· Consumption of alcohol in larger amounts or over a longer period of time than intended. °· Multiple unsuccessful attempts to cut down or control alcohol use.   °· A great deal of time spent obtaining alcohol, using alcohol, or recovering from the effects of alcohol (hangover). °· A strong desire or urge to use alcohol (cravings).   °· Continued use of alcohol despite problems at work, school, or home because of alcohol use.   °· Continued use of alcohol despite problems in relationships because of alcohol use. °· Continued use of alcohol in situations when it is physically hazardous, such as driving a car. °· Continued use of alcohol despite awareness of a physical or psychological problem that is likely related to alcohol use. Physical problems related to alcohol use can involve the brain, heart, liver, stomach, and intestines. Psychological problems related to alcohol use include  intoxication, depression, anxiety, psychosis, delirium, and dementia.   °· The need for increased amounts of alcohol to achieve the same desired effect, or a decreased effect from the consumption of the same amount of alcohol (tolerance). °· Withdrawal symptoms upon reducing or stopping alcohol use, or alcohol use to reduce or avoid withdrawal symptoms. Withdrawal symptoms include: °¨ Racing heart. °¨ Hand tremor. °¨ Difficulty sleeping. °¨ Nausea. °¨ Vomiting. °¨ Hallucinations. °¨ Restlessness. °¨ Seizures. °DIAGNOSIS °Alcohol use disorder is diagnosed through an assessment by your health care provider. Your health care provider may start by asking three or four questions to screen for excessive or problematic alcohol use. To confirm a diagnosis of alcohol use disorder, at least two symptoms must be present within a 12-month period. The severity of alcohol use disorder depends on the number of symptoms: °· Mild--two or three. °· Moderate--four or five. °· Severe--six or more. °Your health care provider may perform a physical exam or use results from lab tests to see if you have physical problems resulting from alcohol use. Your health care provider may refer you to a mental health professional for evaluation. °TREATMENT  °Some people with alcohol use disorder are able to reduce their alcohol use to low-risk levels. Some people with alcohol use disorder need to quit drinking alcohol. When necessary, mental health professionals with specialized training in substance use treatment can help. Your health care provider can help you decide how severe your alcohol use disorder is and   what type of treatment you need. The following forms of treatment are available:  °· Detoxification. Detoxification involves the use of prescription medicines to prevent alcohol withdrawal symptoms in the first week after quitting. This is important for people with a history of symptoms of withdrawal and for heavy drinkers who are likely to  have withdrawal symptoms. Alcohol withdrawal can be dangerous and, in severe cases, cause death. Detoxification is usually provided in a hospital or in-patient substance use treatment facility. °· Counseling or talk therapy. Talk therapy is provided by substance use treatment counselors. It addresses the reasons people use alcohol and ways to keep them from drinking again. The goals of talk therapy are to help people with alcohol use disorder find healthy activities and ways to cope with life stress, to identify and avoid triggers for alcohol use, and to handle cravings, which can cause relapse. °· Medicines. Different medicines can help treat alcohol use disorder through the following actions: °¨ Decrease alcohol cravings. °¨ Decrease the positive reward response felt from alcohol use. °¨ Produce an uncomfortable physical reaction when alcohol is used (aversion therapy). °· Support groups. Support groups are run by people who have quit drinking. They provide emotional support, advice, and guidance. °These forms of treatment are often combined. Some people with alcohol use disorder benefit from intensive combination treatment provided by specialized substance use treatment centers. Both inpatient and outpatient treatment programs are available. °  °This information is not intended to replace advice given to you by your health care provider. Make sure you discuss any questions you have with your health care provider. °  °Document Released: 12/29/2004 Document Revised: 12/12/2014 Document Reviewed: 02/28/2013 °Elsevier Interactive Patient Education ©2016 [employer] ° °

## 2016-07-12 NOTE — Progress Notes (Addendum)
Pt wanted to leave as soon as possible. MD made aware that pt was considering leaving AMA. MD reassessed pt and discharged pt to home. PIV removed without complication. Discharge instructions and detox facilities/options reviewed with pt. Pt refused to wait to be escorted off of unit via wheelchair by volunteer. Pt walked to elevator and refused PCT's attempts to help him leave via wheelchair. Pt walked off of unit despite efforts to escort him.

## 2016-07-15 ENCOUNTER — Inpatient Hospital Stay: Payer: Self-pay

## 2016-07-24 ENCOUNTER — Inpatient Hospital Stay (HOSPITAL_COMMUNITY): Payer: Self-pay

## 2016-07-24 ENCOUNTER — Inpatient Hospital Stay (HOSPITAL_COMMUNITY)
Admission: EM | Admit: 2016-07-24 | Discharge: 2016-08-02 | DRG: 683 | Disposition: A | Discharge status: Home / Self Care | Payer: Self-pay | Attending: Family Medicine | Admitting: Family Medicine

## 2016-07-24 ENCOUNTER — Encounter (HOSPITAL_COMMUNITY): Payer: Self-pay | Admitting: Emergency Medicine

## 2016-07-24 DIAGNOSIS — E778 Other disorders of glycoprotein metabolism: Secondary | ICD-10-CM | POA: Diagnosis present

## 2016-07-24 DIAGNOSIS — F10232 Alcohol dependence with withdrawal with perceptual disturbance: Secondary | ICD-10-CM

## 2016-07-24 DIAGNOSIS — E876 Hypokalemia: Secondary | ICD-10-CM | POA: Diagnosis present

## 2016-07-24 DIAGNOSIS — F10239 Alcohol dependence with withdrawal, unspecified: Secondary | ICD-10-CM | POA: Diagnosis present

## 2016-07-24 DIAGNOSIS — G4700 Insomnia, unspecified: Secondary | ICD-10-CM | POA: Diagnosis present

## 2016-07-24 DIAGNOSIS — R569 Unspecified convulsions: Secondary | ICD-10-CM

## 2016-07-24 DIAGNOSIS — F1010 Alcohol abuse, uncomplicated: Secondary | ICD-10-CM | POA: Diagnosis present

## 2016-07-24 DIAGNOSIS — N179 Acute kidney failure, unspecified: Principal | ICD-10-CM | POA: Diagnosis present

## 2016-07-24 DIAGNOSIS — N50811 Right testicular pain: Secondary | ICD-10-CM

## 2016-07-24 DIAGNOSIS — F10932 Alcohol use, unspecified with withdrawal with perceptual disturbance: Secondary | ICD-10-CM

## 2016-07-24 DIAGNOSIS — F1020 Alcohol dependence, uncomplicated: Secondary | ICD-10-CM | POA: Diagnosis present

## 2016-07-24 DIAGNOSIS — F10939 Alcohol use, unspecified with withdrawal, unspecified: Secondary | ICD-10-CM | POA: Diagnosis present

## 2016-07-24 DIAGNOSIS — E860 Dehydration: Secondary | ICD-10-CM | POA: Diagnosis present

## 2016-07-24 DIAGNOSIS — F17210 Nicotine dependence, cigarettes, uncomplicated: Secondary | ICD-10-CM | POA: Diagnosis present

## 2016-07-24 DIAGNOSIS — Z87442 Personal history of urinary calculi: Secondary | ICD-10-CM

## 2016-07-24 DIAGNOSIS — Z720 Tobacco use: Secondary | ICD-10-CM | POA: Diagnosis present

## 2016-07-24 DIAGNOSIS — G2581 Restless legs syndrome: Secondary | ICD-10-CM | POA: Diagnosis present

## 2016-07-24 DIAGNOSIS — F1410 Cocaine abuse, uncomplicated: Secondary | ICD-10-CM | POA: Diagnosis present

## 2016-07-24 DIAGNOSIS — M542 Cervicalgia: Secondary | ICD-10-CM

## 2016-07-24 LAB — COMPREHENSIVE METABOLIC PANEL
ALK PHOS: 76 U/L (ref 38–126)
ALT: 23 U/L (ref 17–63)
ANION GAP: 9 (ref 5–15)
AST: 19 U/L (ref 15–41)
Albumin: 3.6 g/dL (ref 3.5–5.0)
BILIRUBIN TOTAL: 0.6 mg/dL (ref 0.3–1.2)
BUN: 7 mg/dL (ref 6–20)
CALCIUM: 8.8 mg/dL — AB (ref 8.9–10.3)
CO2: 22 mmol/L (ref 22–32)
CREATININE: 1.21 mg/dL (ref 0.61–1.24)
Chloride: 109 mmol/L (ref 101–111)
GFR calc non Af Amer: >60 mL/min (ref >60)
Glucose, Bld: 95 mg/dL (ref 65–99)
Potassium: 3.3 mmol/L — ABNORMAL LOW (ref 3.5–5.1)
Sodium: 140 mmol/L (ref 135–145)
TOTAL PROTEIN: 5.9 g/dL — AB (ref 6.5–8.1)

## 2016-07-24 LAB — CBC WITH DIFFERENTIAL/PLATELET
Basophils Absolute: 0.1 10*3/uL (ref 0.0–0.1)
Basophils Relative: 1 %
Eosinophils Absolute: 0.5 10*3/uL (ref 0.0–0.7)
Eosinophils Relative: 5 %
HEMATOCRIT: 44.5 % (ref 39.0–52.0)
HEMOGLOBIN: 14.9 g/dL (ref 13.0–17.0)
LYMPHS ABS: 1.6 10*3/uL (ref 0.7–4.0)
LYMPHS PCT: 18 %
MCH: 30.4 pg (ref 26.0–34.0)
MCHC: 33.5 g/dL (ref 30.0–36.0)
MCV: 90.8 fL (ref 78.0–100.0)
MONOS PCT: 8 %
Monocytes Absolute: 0.7 10*3/uL (ref 0.1–1.0)
NEUTROS ABS: 6.4 10*3/uL (ref 1.7–7.7)
NEUTROS PCT: 68 %
Platelets: 258 10*3/uL (ref 150–400)
RBC: 4.9 MIL/uL (ref 4.22–5.81)
RDW: 13.1 % (ref 11.5–15.5)
WBC: 9.3 10*3/uL (ref 4.0–10.5)

## 2016-07-24 LAB — URINALYSIS, ROUTINE W REFLEX MICROSCOPIC
BILIRUBIN URINE: NEGATIVE
GLUCOSE, UA: NEGATIVE mg/dL
HGB URINE DIPSTICK: NEGATIVE
Ketones, ur: NEGATIVE mg/dL
Leukocytes, UA: NEGATIVE
Nitrite: NEGATIVE
PROTEIN: NEGATIVE mg/dL
SPECIFIC GRAVITY, URINE: 1.013 (ref 1.005–1.030)
pH: 6 (ref 5.0–8.0)

## 2016-07-24 LAB — RAPID URINE DRUG SCREEN, HOSP PERFORMED
AMPHETAMINES: NOT DETECTED
Barbiturates: NOT DETECTED
Benzodiazepines: NOT DETECTED
Cocaine: POSITIVE — AB
OPIATES: NOT DETECTED
TETRAHYDROCANNABINOL: NOT DETECTED

## 2016-07-24 LAB — PHOSPHORUS: Phosphorus: 3.5 mg/dL (ref 2.5–4.6)

## 2016-07-24 LAB — LIPASE, BLOOD: Lipase: 35 U/L (ref 11–51)

## 2016-07-24 LAB — MRSA PCR SCREENING: MRSA by PCR: NEGATIVE

## 2016-07-24 LAB — ETHANOL: ALCOHOL ETHYL (B): 119 mg/dL — AB (ref <5)

## 2016-07-24 LAB — MAGNESIUM: MAGNESIUM: 2 mg/dL (ref 1.7–2.4)

## 2016-07-24 MED ORDER — ONDANSETRON HCL 4 MG/2ML IJ SOLN: 4.0000 mg | Freq: Three times a day (TID) | INTRAMUSCULAR | Status: AC | PRN

## 2016-07-24 MED ORDER — NICOTINE 21 MG/24HR TD PT24
21.0000 mg | MEDICATED_PATCH | Freq: Every day | TRANSDERMAL | Status: DC
  Administered 2016-07-24 – 2016-08-02 (×10): 21 mg via TRANSDERMAL
  Filled 2016-07-24 (×11): qty 1

## 2016-07-24 MED ORDER — ACETAMINOPHEN 650 MG RE SUPP: 650.0000 mg | Freq: Four times a day (QID) | RECTAL | Status: DC | PRN

## 2016-07-24 MED ORDER — SODIUM CHLORIDE 0.9 % IV SOLN
INTRAVENOUS | Status: DC
  Administered 2016-07-24 – 2016-07-25 (×2): via INTRAVENOUS

## 2016-07-24 MED ORDER — ACETAMINOPHEN 325 MG PO TABS
650.0000 mg | ORAL_TABLET | Freq: Four times a day (QID) | ORAL | Status: DC | PRN
  Administered 2016-07-24: 650 mg via ORAL
  Filled 2016-07-24: qty 2

## 2016-07-24 MED ORDER — THIAMINE HCL 100 MG/ML IJ SOLN
100.0000 mg | Freq: Every day | INTRAMUSCULAR | Status: DC
  Administered 2016-07-24: 100 mg via INTRAVENOUS
  Filled 2016-07-24: qty 2

## 2016-07-24 MED ORDER — LORAZEPAM 2 MG/ML IJ SOLN
2.0000 mg | INTRAMUSCULAR | Status: DC | PRN
  Administered 2016-07-24 (×4): 2 mg via INTRAVENOUS
  Administered 2016-07-24: 3 mg via INTRAVENOUS
  Administered 2016-07-25: 2 mg via INTRAVENOUS
  Administered 2016-07-25: 3 mg via INTRAVENOUS
  Administered 2016-07-25 (×2): 2 mg via INTRAVENOUS
  Filled 2016-07-24: qty 1
  Filled 2016-07-24: qty 2
  Filled 2016-07-24 (×3): qty 1
  Filled 2016-07-24: qty 2
  Filled 2016-07-24: qty 1
  Filled 2016-07-24: qty 2
  Filled 2016-07-24: qty 1

## 2016-07-24 MED ORDER — LORAZEPAM 2 MG/ML IJ SOLN
0.0000 mg | Freq: Four times a day (QID) | INTRAMUSCULAR | Status: DC
  Administered 2016-07-24: 2 mg via INTRAVENOUS
  Filled 2016-07-24: qty 1

## 2016-07-24 MED ORDER — SODIUM CHLORIDE 0.9 % IV BOLUS (SEPSIS)
2000.0000 mL | Freq: Once | INTRAVENOUS | Status: AC
  Administered 2016-07-24: 2000 mL via INTRAVENOUS

## 2016-07-24 MED ORDER — LORAZEPAM 2 MG/ML IJ SOLN: 0.0000 mg | Freq: Two times a day (BID) | INTRAMUSCULAR | Status: DC

## 2016-07-24 MED ORDER — POTASSIUM CHLORIDE CRYS ER 20 MEQ PO TBCR
40.0000 meq | EXTENDED_RELEASE_TABLET | Freq: Once | ORAL | Status: AC
  Administered 2016-07-24: 40 meq via ORAL
  Filled 2016-07-24: qty 2

## 2016-07-24 MED ORDER — ONDANSETRON HCL 4 MG/2ML IJ SOLN
4.0000 mg | Freq: Once | INTRAMUSCULAR | Status: AC
  Administered 2016-07-24: 4 mg via INTRAVENOUS
  Filled 2016-07-24: qty 2

## 2016-07-24 MED ORDER — VITAMIN B-1 100 MG PO TABS
100.0000 mg | ORAL_TABLET | Freq: Every day | ORAL | Status: DC
  Administered 2016-07-24 – 2016-08-02 (×10): 100 mg via ORAL
  Filled 2016-07-24 (×10): qty 1

## 2016-07-24 MED ORDER — CHLORDIAZEPOXIDE HCL 25 MG PO CAPS
50.0000 mg | ORAL_CAPSULE | Freq: Three times a day (TID) | ORAL | Status: DC
  Administered 2016-07-24 – 2016-07-25 (×5): 50 mg via ORAL
  Filled 2016-07-24 (×5): qty 2

## 2016-07-24 MED ORDER — ENOXAPARIN SODIUM 40 MG/0.4ML ~~LOC~~ SOLN
40.0000 mg | SUBCUTANEOUS | Status: DC
  Administered 2016-07-24 – 2016-07-31 (×6): 40 mg via SUBCUTANEOUS
  Filled 2016-07-24 (×8): qty 0.4

## 2016-07-24 MED ORDER — MORPHINE SULFATE (PF) 2 MG/ML IV SOLN
1.0000 mg | INTRAVENOUS | Status: DC | PRN
  Administered 2016-07-24 (×2): 2 mg via INTRAVENOUS
  Administered 2016-07-25 – 2016-07-27 (×9): 4 mg via INTRAVENOUS
  Filled 2016-07-24 (×2): qty 2
  Filled 2016-07-24 (×2): qty 1
  Filled 2016-07-24 (×2): qty 2
  Filled 2016-07-24: qty 1
  Filled 2016-07-24 (×5): qty 2
  Filled 2016-07-24: qty 1

## 2016-07-24 MED ORDER — THIAMINE HCL 100 MG/ML IJ SOLN
Freq: Once | INTRAVENOUS | Status: AC
  Administered 2016-07-24: 08:00:00 via INTRAVENOUS
  Filled 2016-07-24: qty 1000

## 2016-07-24 MED ORDER — LORAZEPAM 2 MG/ML IJ SOLN
2.0000 mg | Freq: Once | INTRAMUSCULAR | Status: AC
  Administered 2016-07-24: 2 mg via INTRAVENOUS
  Filled 2016-07-24: qty 1

## 2016-07-24 NOTE — ED Notes (Signed)
Dr. Renne Crigler MD at bedside.

## 2016-07-24 NOTE — Plan of Care (Signed)
Problem: Safety: Goal: Ability to remain free from injury will improve Outcome: Progressing Patient agrees to have bed alarm placed on and verbally agrees to use callbell for assistance from staff before attempting to get OOB alone.

## 2016-07-24 NOTE — ED Triage Notes (Signed)
Pt brought to ED by [facility] from [facility] for withdraw sx of ETOH, pt having cramps all over his body, tremors, nausea, 7/10 HA, states he usually drinks 3 gallons of vodka, las use 4 hours ago, uses cocaine las use 2 days ago, BP 140/100 for EMS with HR 110. Pt AO x 4, NAD noticed.

## 2016-07-24 NOTE — Progress Notes (Signed)
Pt states to RN that he does not want anyone to be able to know that he is in the hospital other than [facility]. Patient status has been made XXX-Private at his request.

## 2016-07-24 NOTE — Progress Notes (Signed)
Called into room by patient. States he hears voices in his head that tell him to kill himself. States his head is throbbing & pounding. Explained the need for suicide precautions to patient. MD made aware.

## 2016-07-24 NOTE — Progress Notes (Addendum)
Routine follow-up chart surveillance. Noted CIWA score has increased to 20. Contacted RN and patient now with increasing auditory hallucinations and endorsement of suicidal ideation in setting of acute alcohol withdrawal. Discussed with attending and have added Librium 50 mg by mouth on schedule every 8 hours. If continues to have severe alcohol withdrawal symptoms may require critical care medicine consultation for consideration to transfer to ICU and initiate Precedex infusion.  Erin Hearing, ANP

## 2016-07-24 NOTE — Progress Notes (Signed)
Patient arrived to room 2C. Vitals taken, CHG bath done, patient helped to bathroom for BM. Urinal, call-bell, room phone, personal cellphones (2) all within reach of patient at bedside tables. Bed alarm on, patient verbalized understanding that he must use call bell before getting OOB.  Patient states last drink was at approximately 11pm Saturday night 07/23/16. (Jagerbomb shots). Patient states that he currently smokes pack and a half of cigarettes daily. Will attempt to get nicotine patch for patient from MD.

## 2016-07-24 NOTE — Progress Notes (Signed)
ciwa done at 1400

## 2016-07-24 NOTE — H&P (Signed)
History and Physical    Thomas Blake [id number] DOB: [birth year] DOA: 07/24/2016   PCP: No PCP Per Patient -with [facility] [place] was arranged for 8/11 with [place] Pink, NP but review of EMR shows patient did not show for that appointment  Patient coming from/Resides with: Private residence/roommate  Chief Complaint: Acute alcohol withdrawal desiring treatment  HPI: Thomas Blake is a 35 y.o. male with medical history significant for ongoing polysubstance abuse including alcohol cocaine and tobacco who was admitted on 8/6 for suspected seizure activity related to acute alcohol withdrawal. Patient had attempted to detox self at home. Unfortunately he left AMA 2 days later. Patient apparently returned to heavy drinking after discharge. Upon my initial evaluation of the patient he has been given multiples sedative medications. I attempted to awaken him to begin my history; he would've rales but was somewhat agitated and would not awaken fully and answer any questions. History obtained from the medical record. According to the ER provider's notes, the patient admits to drinking up to 3 gallons of vodka per day as well as using cocaine. According to prior H&P patient had been drinking high volumes of alcohol for the past 5 years. He reports his last drink was 12 hours prior. He presented with headache, auditory hallucinations, diaphoresis, vomiting abdominal pain and tremors. No seizure activity reported. Patient told ER staff he wishes to get clean because the alcohol "is killing me". He reports he drinks as soon as he gets out of bed calling this "an eye opener". ER provider documented patient was tremulous and exhibiting other signs of withdrawal at time of presentation. Alcohol level is 112. UDS is positive for cocaine.  ED Course:  Vital signs: 99.5-111/71-88-29-room air 99% Lab data: Sodium 140, potassium 3.3, CO2 22 BUN 7, creatinine 1.21, calcium 8.8, LFTs are normal, Total  protein 5.9, WBC 9300 with normal differential, hemoglobin 14.9, platelets 258,000, urinalysis unremarkable, urine drug screen positive for cocaine, ethyl alcohol 119 Medications and treatments: Normal saline bolus 2 L, Ativan 2 mg IV 1, vitamin B 100 mg tablet 1, thiamine injection 100 mg IV 1, Zofran 4 mg IV 1, potassium 40 mEq tablet 1, Ativan 2 mg IV 1  Review of Systems:  **Unable to obtain directly from patient giving inability to participate with history therefore partial review of systems obtained from the ER record (see ER provider note)   Past Medical History:  Diagnosis Date  . AKI (acute kidney injury) ([place])   . Cancer [facility]) 2009   testicular  . ETOH abuse   . Seizure ([place])   . Tobacco abuse     Past Surgical History:  Procedure Laterality Date  . ORCHIECTOMY  2009    Social History   Social History  . Marital status: Divorced    Spouse name: N/A  . Number of children: N/A  . Years of education: N/A   Occupational History  . Not on file.   Social History Main Topics  . Smoking status: Current Every Day Smoker    Types: Cigarettes  . Smokeless tobacco: Never Used  . Alcohol use Yes     Comment: 1/2 gallon of vodka daily  . Drug use:     Types: Cocaine  . Sexual activity: Yes    Birth control/ protection: None   Other Topics Concern  . Not on file   Social History Narrative  . No narrative on file    Mobility: Without assistive devices Work history: Unknown  Allergies  Allergen Reactions  . Tramadol     Seizure?    Family History  Problem Relation Age of Onset  . Diabetes Mother   . Diabetes Father   . CAD Father   . Microcephaly Father      Prior to Admission medications   Medication Sig Start Date End Date Taking? Authorizing Provider  folic acid (FOLVITE) 1 MG tablet Take 1 tablet (1 mg total) by mouth daily. 07/12/16   Jani Gravel, MD  rOPINIRole (REQUIP) 3 MG tablet Take 3 mg by mouth 3 (three) times daily.    Historical  Provider, MD  thiamine 100 MG tablet Take 1 tablet (100 mg total) by mouth daily. 07/12/16   Jani Gravel, MD    Physical Exam: Vitals:   07/24/16 0600 07/24/16 0700 07/24/16 0710 07/24/16 0730  BP: 115/85     Pulse:  87  74  Resp:  18  16  Temp:      TempSrc:      SpO2:  98% 99% 100%  Weight:      Height:          Constitutional: NAD, calm, comfortable Eyes: PERRL, lids and conjunctivae normal ENMT: Mucous membranes are moist. Posterior pharynx clear of any exudate or lesions.Normal dentition.  Neck: normal, supple, no masses, no thyromegaly Respiratory: clear to auscultation bilaterally, no wheezing, no crackles. Normal respiratory effort. No accessory muscle use.  Cardiovascular: Regular rate and rhythm, no murmurs / rubs / gallops. No extremity edema. 2+ pedal pulses. No carotid bruits.  Abdomen: no tenderness, no masses palpated. No hepatosplenomegaly. Bowel sounds positive.  Musculoskeletal: no clubbing / cyanosis. No joint deformity upper and lower extremities. Good ROM, no contractures. Normal muscle tone.  Skin: no rashes, lesions, ulcers. No induration Neurologic: Based upon gross inspection and observation: CN 2-12 grossly intact. Sensation intact (able to awaken patient with tactile stimulation), DTR normal. Unable to test strength due to inability to participate with exam but patient spontaneously moving all extremities equally 4 with purposeful movement.  Psychiatric: Unable to obtain accurate exam given patient's inability to completely participate at time of my evaluation    Labs on Admission: I have personally reviewed following labs and imaging studies  CBC:  Recent Labs Lab 07/24/16 0450  WBC 9.3  NEUTROABS 6.4  HGB 14.9  HCT 44.5  MCV 90.8  PLT 0000000   Basic Metabolic Panel:  Recent Labs Lab 07/24/16 0450  NA 140  K 3.3*  CL 109  CO2 22  GLUCOSE 95  BUN 7  CREATININE 1.21  CALCIUM 8.8*  MG 2.0  PHOS 3.5   GFR: Estimated Creatinine  Clearance: 91.6 mL/min (by C-G formula based on SCr of 1.21 mg/dL). Liver Function Tests:  Recent Labs Lab 07/24/16 0450  AST 19  ALT 23  ALKPHOS 76  BILITOT 0.6  PROT 5.9*  ALBUMIN 3.6    Recent Labs Lab 07/24/16 0450  LIPASE 35   No results for input(s): AMMONIA in the last 168 hours. Coagulation Profile: No results for input(s): INR, PROTIME in the last 168 hours. Cardiac Enzymes: No results for input(s): CKTOTAL, CKMB, CKMBINDEX, TROPONINI in the last 168 hours. BNP (last 3 results) No results for input(s): PROBNP in the last 8760 hours. HbA1C: No results for input(s): HGBA1C in the last 72 hours. CBG: No results for input(s): GLUCAP in the last 168 hours. Lipid Profile: No results for input(s): CHOL, HDL, LDLCALC, TRIG, CHOLHDL, LDLDIRECT in the last 72 hours. Thyroid Function Tests:  No results for input(s): TSH, T4TOTAL, FREET4, T3FREE, THYROIDAB in the last 72 hours. Anemia Panel: No results for input(s): VITAMINB12, FOLATE, FERRITIN, TIBC, IRON, RETICCTPCT in the last 72 hours. Urine analysis:    Component Value Date/Time   COLORURINE YELLOW 07/24/2016 0610   APPEARANCEUR CLEAR 07/24/2016 0610   LABSPEC 1.013 07/24/2016 0610   PHURINE 6.0 07/24/2016 0610   GLUCOSEU NEGATIVE 07/24/2016 0610   HGBUR NEGATIVE 07/24/2016 0610   BILIRUBINUR NEGATIVE 07/24/2016 0610   KETONESUR NEGATIVE 07/24/2016 0610   PROTEINUR NEGATIVE 07/24/2016 0610   UROBILINOGEN 0.2 07/16/2010 2240   NITRITE NEGATIVE 07/24/2016 0610   LEUKOCYTESUR NEGATIVE 07/24/2016 0610   Sepsis Labs: @LABRCNTIP (procalcitonin:4,lacticidven:4) )No results found for this or any previous visit (from the past 240 hour(s)).   Radiological Exams on Admission: No results found.  EKG: (Independently reviewed) Sinus tachycardia with ventricular rate 101 bpm, QTC 4 and 24 ms, elevated J-point in inferior lateral leads otherwise unremarkable EKG  Assessment/Plan Principal Problem:   Alcohol  withdrawal/alcohol abuse plus Cocaine abuse -This patient's second admission within the month for alcohol withdrawal symptoms -Initially admitted as observation since during previous admission left AMA but if meets criteria for inpatient can likely be transitioned to inpatient status within 24 hours after admission -Step down CIWA with Ativan although during previous admission did require Librium so monitor closely -IV fluid rehydration and other supportive care -Social work consulted to provide outpatient resources for alcohol withdrawal treatment programs -Seizure precautions -LFTs are normal and no laboratory signs of cirrhosis including no evidence of coagulopathy/thrombocytopenia  Active Problems:   Mild dehydration -Continue IV fluids-ordered a banana bag x 1 prior to transport to floor    Tobacco abuse -Nicotine patch    Hypoproteinemia  -Likely from malnutrition setting of ongoing alcohol abuse-high volume -Nutrition consultation -Continue thiamine and folate although not anemic -Regular diet      DVT prophylaxis: Lovenox Code Status: Full based on previous admission  Family Communication: No friends or family at bedside at time of admission Disposition Plan: Anticipate discharge back to preadmission home environment when medically stable if does not leave AMA-hopeful patient will pursue outpatient alcohol rehabilitative treatment options offered to him by social work Consults called: None  Admission status: Observation status but likely will transition to inpatient if does not leave AMA in the next 24 hours/step down    Eydan Chianese L. ANP-BC [facility] Pager [phone]   If 7PM-7AM, please contact night-coverage [email or web address] Password [facility]  07/24/2016, 7:43 AM

## 2016-07-24 NOTE — Progress Notes (Signed)
Received call from utilization review RN. After reviewing the case with the utilization U physician it was determined that the patient is appropriate for inpatient status and his admission status has thus been changed in the orders.  Erin Hearing, ANP

## 2016-07-24 NOTE — Progress Notes (Signed)
CTSP by nurse regarding persistently elevated CIWA of 20 and outpatient complaining of right testicular area pain. Prior history of testicular cancer with apparent orchiectomy. Patient ports recent issues with recurrent nausea and vomiting prior to this admission and an episode of similar testicular pain about one week prior. On clinical exam patient points to an area behind the right portion of the scrotum stating this is what hurts. I was unable to palpate a testicle in that region. He appeared to have a descended testes on the left. He's complaining of left super testicle/groin swelling. With palpation over the scrotum I was unable to reproduce any significant pain but given patient's current altered mentation from alcohol withdrawal feel it is prudent to proceed with stat scrotal ultrasound to rule out any emergent issue such as testicular torsion or incarcerated hernia although I could also do not appreciate any inguinal defects. Patient is pleasant but remains anxious standing at the side of the bed pacing back and forth. I have added morphine injection 1-4 mg every 2 hours for moderate pain and this should also assist with patient's withdrawal symptoms.  Erin Hearing, ANP

## 2016-07-24 NOTE — Progress Notes (Signed)
[facility] called by this RN at request of patient to let her know that he was in the hospital. Voicemail was left for [facility] stating that patient wished for [place] to call and room phone # and patient room # information left on voicemail.

## 2016-07-24 NOTE — ED Provider Notes (Signed)
[facility] DEPT Provider Note   CSN: [id number] Arrival date & time: 07/24/16  0358     History   Chief Complaint Chief Complaint  Patient presents with  . Alcohol Problem    HPI Thomas Blake is a 35 y.o. male.  Thomas Blake is a 35 y.o. male  with a hx of tobacco abuse, alcohol abuse, cocaine abuse presents to the Emergency Department complaining of gradual, persistent, progressively worsening withdrawal symptoms onset approximately 12 hours ago when he had his last drink. Patient is complaining of headache, hearing voices, sweating, vomiting, abdominal pain, shaking. He states he normally drinks approximately 3 gallons of vodka per day.  He states that he attempted to sober up approximately 2 weeks ago. This was attempted on an outpatient basis however patient had grand mal seizure and was hospitalized for several days. He reports concern for repeat seizures.  Patient reports his last cocaine use was 2 days ago. No aggravating or alleviating factors.  Patient reports he wishes to get clean because the alcohol is killing him. He reports he requires an eye opener every morning just to get out of bed.   The history is provided by the patient and medical records. No language interpreter was used.    Past Medical History:  Diagnosis Date  . AKI (acute kidney injury) ([place])   . Cancer [facility]) 2009   testicular  . ETOH abuse   . Seizure ([place])   . Tobacco abuse     Patient Active Problem List   Diagnosis Date Noted  . Alcohol withdrawal ([place]) 07/24/2016  . Tobacco abuse 07/10/2016  . Cocaine abuse 07/10/2016  . Seizure ([place])   . ETOH abuse   . AKI (acute kidney injury) ([place])   . Cancer ([place]) 12/06/2007    Past Surgical History:  Procedure Laterality Date  . ORCHIECTOMY  2009       Home Medications    Prior to Admission medications   Medication Sig Start Date End Date Taking? Authorizing Provider  folic acid (FOLVITE) 1 MG tablet Take 1 tablet (1 mg total) by  mouth daily. 07/12/16   Jani Gravel, MD  rOPINIRole (REQUIP) 3 MG tablet Take 3 mg by mouth 3 (three) times daily.    Historical Provider, MD  thiamine 100 MG tablet Take 1 tablet (100 mg total) by mouth daily. 07/12/16   Jani Gravel, MD    Family History Family History  Problem Relation Age of Onset  . Diabetes Mother   . Diabetes Father   . CAD Father   . Microcephaly Father     Social History Social History  Substance Use Topics  . Smoking status: Current Every Day Smoker    Types: Cigarettes  . Smokeless tobacco: Never Used  . Alcohol use Yes     Comment: 1/2 gallon of vodka daily     Allergies   Tramadol   Review of Systems Review of Systems  Constitutional: Positive for diaphoresis.  Gastrointestinal: Positive for abdominal pain, nausea and vomiting.  Neurological: Positive for tremors and headaches.  Psychiatric/Behavioral: Positive for hallucinations.  All other systems reviewed and are negative.    Physical Exam Updated Vital Signs BP 115/85   Pulse 94   Temp 99.5 F (37.5 C) (Oral)   Resp 20   Ht 5\' 11"  (1.803 m)   Wt 77.6 kg   SpO2 100%   BMI 23.85 kg/m   Physical Exam  Constitutional: He appears well-developed and well-nourished. He appears distressed.  Awake, alert, nontoxic appearance  HENT:  Head: Normocephalic and atraumatic.  Mouth/Throat: Oropharynx is clear and moist. Mucous membranes are dry. No oropharyngeal exudate.  Eyes: Right conjunctiva is injected. Left conjunctiva is injected. No scleral icterus.  Neck: Normal range of motion. Neck supple.  Cardiovascular: Normal rate, regular rhythm and intact distal pulses.   Pulmonary/Chest: Effort normal and breath sounds normal. No respiratory distress. He has no wheezes.  Equal chest expansion  Abdominal: Soft. Bowel sounds are normal. He exhibits no mass. There is generalized tenderness. There is no rebound, no guarding, no tenderness at McBurney's point and negative Murphy's sign.    Musculoskeletal: Normal range of motion. He exhibits no edema.  Neurological: He is alert. He has normal strength. He displays tremor ( Generalized).  Speech is clear and goal oriented Moves extremities without ataxia  Skin: Skin is warm. He is diaphoretic.  Psychiatric: He has a normal mood and affect.  Nursing note and vitals reviewed.    ED Treatments / Results  Labs (all labs ordered are listed, but only abnormal results are displayed) Labs Reviewed  COMPREHENSIVE METABOLIC PANEL - Abnormal; Notable for the following:       Result Value   Potassium 3.3 (*)    Calcium 8.8 (*)    Total Protein 5.9 (*)    All other components within normal limits  ETHANOL - Abnormal; Notable for the following:    Alcohol, Ethyl (B) 119 (*)    All other components within normal limits  CBC WITH DIFFERENTIAL/PLATELET  LIPASE, BLOOD  MAGNESIUM  URINALYSIS, ROUTINE W REFLEX MICROSCOPIC (NOT AT [facility])  URINE RAPID DRUG SCREEN, HOSP PERFORMED    EKG  EKG Interpretation  Date/Time:  Sunday July 24 2016 04:18:50 EDT Ventricular Rate:  101 PR Interval:    QRS Duration: 77 QT Interval:  327 QTC Calculation: 424 R Axis:   47 Text Interpretation:  Sinus tachycardia Confirmed by HAVILAND MD, JULIE ([id number]) on 07/24/2016 5:11:04 AM       Radiology No results found.  Procedures Procedures (including critical care time)  Medications Ordered in ED Medications  LORazepam (ATIVAN) injection 0-4 mg (2 mg Intravenous Given 07/24/16 0443)    Followed by  LORazepam (ATIVAN) injection 0-4 mg (not administered)  thiamine (VITAMIN B-1) tablet 100 mg ( Oral See Alternative 07/24/16 0443)    Or  thiamine (B-1) injection 100 mg (100 mg Intravenous Given 07/24/16 0443)  sodium chloride 0.9 % bolus 2,000 mL (2,000 mLs Intravenous New Bag/Given 07/24/16 0438)  ondansetron (ZOFRAN) injection 4 mg (4 mg Intravenous Given 07/24/16 [id number])  potassium chloride SA (K-DUR,KLOR-CON) CR tablet 40 mEq (40 mEq Oral  Given 07/24/16 0613)  LORazepam (ATIVAN) injection 2 mg (2 mg Intravenous Given 07/24/16 [id number])     Initial Impression / Assessment and Plan / ED Course  I have reviewed the triage vital signs and the nursing notes.  Pertinent labs & imaging results that were available during my care of the patient were reviewed by me and considered in my medical decision making (see chart for details).  Clinical Course  Value Comment By Time   CIWA [place], PA-C 08/20 0458  Alcohol, Ethyl (B): (!) 119 (Reviewed) Abigail Butts, PA-C 08/20 0532  Potassium: (!) 3.3 Mild hypokalemia. Will replete. Jarrett Soho Eliana Lueth, PA-C 08/20 0532  WBC: 9.3 No leukocytosis, no anemia. Jarrett Soho Riona Lahti, PA-C 08/20 0533  Magnesium: 2.0 Within normal limits Abigail Butts, PA-C 08/20 0533  Lipase: 35 Within normal limits [facility], PA-C 08/20  0533  Temp: 99.5 F (37.5 C) Minimally elevated temperature, no tachycardia. No hypertension. Jarrett Soho Tyrion Glaude, PA-C 08/20 0533   Pt continues to be agitated and restless.  Will give additional ativan. Abigail Butts, PA-C 08/20 [id number]   Discussed with Dr Lorin Mercy.  Stepdown, obs bed [employer], PA-C 08/20 [id number]  EKG 12-Lead NSR without QT prolongation Abigail Butts, PA-C 08/20 [id number]    Patient with history of complicated withdrawal and withdrawal seizures. Elevated CIWA score patient continues to be restless and agitated after 4 mg of Ativan. Will admit for further evaluation and treatment.  Final Clinical Impressions(s) / ED Diagnoses   Final diagnoses:  Alcohol withdrawal, with perceptual disturbance ([place])  Hypokalemia    New Prescriptions New Prescriptions   No medications on file     Abigail Butts, PA-C 07/24/16 [place], MD 07/24/16 [phone]

## 2016-07-24 NOTE — Progress Notes (Signed)
Lissa Merlin, NP notified that repeat CIWA 20 and patient new c/o Right testicular pain. 3mg  Ativan given for CIWA. Will continue to monitor closely.

## 2016-07-25 DIAGNOSIS — F10231 Alcohol dependence with withdrawal delirium: Secondary | ICD-10-CM

## 2016-07-25 DIAGNOSIS — F1010 Alcohol abuse, uncomplicated: Secondary | ICD-10-CM

## 2016-07-25 LAB — CBC
HCT: 42.8 % (ref 39.0–52.0)
HEMOGLOBIN: 14 g/dL (ref 13.0–17.0)
MCH: 30.2 pg (ref 26.0–34.0)
MCHC: 32.7 g/dL (ref 30.0–36.0)
MCV: 92.2 fL (ref 78.0–100.0)
Platelets: 207 10*3/uL (ref 150–400)
RBC: 4.64 MIL/uL (ref 4.22–5.81)
RDW: 13.3 % (ref 11.5–15.5)
WBC: 5.3 10*3/uL (ref 4.0–10.5)

## 2016-07-25 LAB — COMPREHENSIVE METABOLIC PANEL
ALBUMIN: 2.9 g/dL — AB (ref 3.5–5.0)
ALT: 21 U/L (ref 17–63)
ANION GAP: 6 (ref 5–15)
AST: 20 U/L (ref 15–41)
Alkaline Phosphatase: 66 U/L (ref 38–126)
BILIRUBIN TOTAL: 0.8 mg/dL (ref 0.3–1.2)
BUN: 7 mg/dL (ref 6–20)
CALCIUM: 8.3 mg/dL — AB (ref 8.9–10.3)
CO2: 21 mmol/L — ABNORMAL LOW (ref 22–32)
Chloride: 112 mmol/L — ABNORMAL HIGH (ref 101–111)
Creatinine, Ser: 1.11 mg/dL (ref 0.61–1.24)
GFR calc Af Amer: >60 mL/min (ref >60)
GFR calc non Af Amer: >60 mL/min (ref >60)
Glucose, Bld: 101 mg/dL — ABNORMAL HIGH (ref 65–99)
Potassium: 3.3 mmol/L — ABNORMAL LOW (ref 3.5–5.1)
SODIUM: 139 mmol/L (ref 135–145)
TOTAL PROTEIN: 5.1 g/dL — AB (ref 6.5–8.1)

## 2016-07-25 MED ORDER — ADULT MULTIVITAMIN W/MINERALS CH
1.0000 | ORAL_TABLET | Freq: Every day | ORAL | Status: DC
  Administered 2016-07-25 – 2016-08-02 (×9): 1 via ORAL
  Filled 2016-07-25 (×9): qty 1

## 2016-07-25 MED ORDER — LORAZEPAM 2 MG/ML IJ SOLN
4.0000 mg | INTRAMUSCULAR | Status: DC | PRN
  Administered 2016-07-25 – 2016-07-29 (×14): 4 mg via INTRAVENOUS
  Filled 2016-07-25 (×16): qty 2

## 2016-07-25 MED ORDER — POTASSIUM CHLORIDE CRYS ER 20 MEQ PO TBCR
40.0000 meq | EXTENDED_RELEASE_TABLET | Freq: Once | ORAL | Status: AC
  Administered 2016-07-25: 40 meq via ORAL
  Filled 2016-07-25: qty 2

## 2016-07-25 MED ORDER — ONDANSETRON HCL 4 MG/2ML IJ SOLN
4.0000 mg | INTRAMUSCULAR | Status: DC | PRN
  Administered 2016-07-25 – 2016-07-30 (×2): 4 mg via INTRAVENOUS
  Filled 2016-07-25 (×2): qty 2

## 2016-07-25 MED ORDER — HALOPERIDOL LACTATE 5 MG/ML IJ SOLN
5.0000 mg | Freq: Once | INTRAMUSCULAR | Status: AC | PRN
  Administered 2016-07-25: 5 mg via INTRAVENOUS
  Filled 2016-07-25: qty 1

## 2016-07-25 MED ORDER — SODIUM CHLORIDE 0.9 % IV SOLN
INTRAVENOUS | Status: AC
  Administered 2016-07-25: 12:00:00 via INTRAVENOUS
  Filled 2016-07-25: qty 1000

## 2016-07-25 NOTE — Progress Notes (Signed)
Initial Nutrition Assessment  DOCUMENTATION CODES:   Severe malnutrition in context of acute illness/injury  INTERVENTION:  Patient to order snacks TID between meals. Discussed choosing protein with each meal and snack.  Provide multivitamin daily.   NUTRITION DIAGNOSIS:   Malnutrition (Severe) related to acute illness (alcohol abuse) as evidenced by energy intake < or equal to 50% for > or equal to 5 days, 11 percent weight loss over 2 weeks.  GOAL:   Patient will meet greater than or equal to 90% of their needs  MONITOR:   PO intake, Labs, I & O's  REASON FOR ASSESSMENT:   Consult Assessment of nutrition requirement/status  ASSESSMENT:   35 y.o. M with history of cocaine abuse and alcohol abuse, presented with complaints of withdrawal and hallucinations. Also has history of seizures in setting of alcohol withdrawal.    Mr. Reckard reports his appetite has been good PTA and remains good now. He did have some nausea after breakfast, but has been able to complete 100% of meals. UBW 200 lbs, but acknowledges weight loss over the past 3-4 months and now weights 180 lbs. Per chart, pt has lost 10.3 kg (11% body weight) over 2 weeks (since 8/6). He reports weight loss is due to eating less than normal in setting of drinking. He is only eating 1 meal/day consisting of peanut butter + jelly sandwich or breakfast food. Takes MVM daily when he remembers.   Meal Completion: 100% dinner last night and breakfast this morning  Medications reviewed and include: Thiamine 100 mg daily, NS @ 125 ml/hr.  Labs reviewed: Potassium 3.3.  Nutrition-Focused physical exam completed. Findings are no fat depletion, no muscle depletion, and no edema.   Discussed plan with RN.  Diet Order:  Diet regular Room service appropriate? Yes; Fluid consistency: Thin  Skin:  Wound (see comment) (Abrasion right posterior heel)  Last BM:  07/24/2016  Height:   Ht Readings from Last 1 Encounters:  07/24/16  5\' 10"  (1.778 m)    Weight:   Wt Readings from Last 1 Encounters:  07/24/16 182 lb 5.1 oz (82.7 kg)    Ideal Body Weight:  75.5 kg  BMI:  Body mass index is 26.16 kg/m.  Estimated Nutritional Needs:   Kcal:  [id number]  Protein:  100-115 grams  Fluid:  >/= 2.2 L/day  EDUCATION NEEDS:   Education needs addressed (Importance of adequate protein and calories at meals and snacks.)  Willey Blade, MS, RD, LDN

## 2016-07-25 NOTE — Progress Notes (Signed)
PROGRESS NOTE  Thomas Blake [id number] DOB: 07/09/1981 DOA: 07/24/2016 PCP: No PCP Per Patient  Brief History:  35 year old male with a history of alcohol drop seizure, alcohol abuse, cocaine abuse, tobacco abuse, testicular cancer presented with hallucinations and wanting help with detox. The patient left AGAINST MEDICAL ADVICE after a hospital stay from 07/10/2016 through 07/12/2016. Since admission, the patient was placed on Librium and alcohol withdrawal protocol requiring increasing amounts of lorazepam IV. He was started on intravenous fluids and monitored in the stepdown unit. The patient continues to drink with his last drink on the morning of 07/24/2016. He last snorted cocaine 3 days prior to admission.  Assessment/Plan: Alcohol withdrawal -Continue Librium 50 mg every 8 hours -Continue alcohol withdrawal protocol -07/25/2016 a.m.--patient appears very comfortable eating breakfast -Continue thiamine  Polysubstance abuse -Including alcohol, tobacco, cocaine -Monitor for signs of withdrawal -NicoDerm patch -Cessation discussed  Testicular pain -Status post left orchiectomy -Scrotal ultrasound negative -Resolved  Hypokalemia -Replete -Check magnesium    Disposition Plan:   Home in 1-2 days  Family Communication:   Family at bedside  Consultants:none    Code Status:  FULL   DVT Prophylaxis:  [place] Lovenox   Procedures: As Listed in Progress Note Above  Antibiotics: None    Subjective: Patient denies fevers, chills, headache, chest pain, dyspnea, nausea, vomiting, diarrhea, abdominal pain, dysuria, hematuria, hematochezia, and melena.   Objective: Vitals:   07/25/16 0000 07/25/16 0300 07/25/16 0404 07/25/16 0707  BP: 110/69 117/87 118/75 111/69  Pulse:  66 75 75  Resp:  13 14 15   Temp:  98.3 F (36.8 C) 98.9 F (37.2 C) 97.3 F (36.3 C)  TempSrc:  Axillary Axillary Oral  SpO2:  96% 97% 96%  Weight:      Height:         Intake/Output Summary (Last 24 hours) at 07/25/16 0952 Last data filed at 07/25/16 0459  Gross per 24 hour  Intake          2907.92 ml  Output             2475 ml  Net           432.92 ml   Weight change: 5.135 kg (11 lb 5.1 oz) Exam:   General:  Pt is alert, follows commands appropriately, not in acute distress  HEENT: No icterus, No thrush, No neck mass, [place]/AT  Cardiovascular: RRR, S1/S2, no rubs, no gallops  Respiratory: CTA bilaterally, no wheezing, no crackles, no rhonchi  Abdomen: Soft/+BS, non tender, non distended, no guarding  Extremities: No edema, No lymphangitis, No petechiae, No rashes, no synovitis   Data Reviewed: I have personally reviewed following labs and imaging studies Basic Metabolic Panel:  Recent Labs Lab 07/24/16 0450 07/25/16 0354  NA 140 139  K 3.3* 3.3*  CL 109 112*  CO2 22 21*  GLUCOSE 95 101*  BUN 7 7  CREATININE 1.21 1.11  CALCIUM 8.8* 8.3*  MG 2.0  --   PHOS 3.5  --    Liver Function Tests:  Recent Labs Lab 07/24/16 0450 07/25/16 0354  AST 19 20  ALT 23 21  ALKPHOS 76 66  BILITOT 0.6 0.8  PROT 5.9* 5.1*  ALBUMIN 3.6 2.9*    Recent Labs Lab 07/24/16 0450  LIPASE 35   No results for input(s): AMMONIA in the last 168 hours. Coagulation Profile: No results for input(s): INR, PROTIME in the last 168 hours. CBC:  Recent Labs Lab 07/24/16 0450 07/25/16 0354  WBC 9.3 5.3  NEUTROABS 6.4  --   HGB 14.9 14.0  HCT 44.5 42.8  MCV 90.8 92.2  PLT 258 207   Cardiac Enzymes: No results for input(s): CKTOTAL, CKMB, CKMBINDEX, TROPONINI in the last 168 hours. BNP: Invalid input(s): POCBNP CBG: No results for input(s): GLUCAP in the last 168 hours. HbA1C: No results for input(s): HGBA1C in the last 72 hours. Urine analysis:    Component Value Date/Time   COLORURINE YELLOW 07/24/2016 0610   APPEARANCEUR CLEAR 07/24/2016 0610   LABSPEC 1.013 07/24/2016 0610   PHURINE 6.0 07/24/2016 0610   GLUCOSEU NEGATIVE  07/24/2016 0610   HGBUR NEGATIVE 07/24/2016 0610   BILIRUBINUR NEGATIVE 07/24/2016 0610   KETONESUR NEGATIVE 07/24/2016 0610   PROTEINUR NEGATIVE 07/24/2016 0610   UROBILINOGEN 0.2 07/16/2010 2240   NITRITE NEGATIVE 07/24/2016 0610   LEUKOCYTESUR NEGATIVE 07/24/2016 0610   Sepsis Labs: @LABRCNTIP (procalcitonin:4,lacticidven:4) ) Recent Results (from the past 240 hour(s))  MRSA PCR Screening     Status: None   Collection Time: 07/24/16 10:44 AM  Result Value Ref Range Status   MRSA by PCR NEGATIVE NEGATIVE Final    Comment:        The GeneXpert MRSA Assay (FDA approved for NASAL specimens only), is one component of a comprehensive MRSA colonization surveillance program. It is not intended to diagnose MRSA infection nor to guide or monitor treatment for MRSA infections.      Scheduled Meds: . chlordiazePOXIDE  50 mg Oral TID  . enoxaparin (LOVENOX) injection  40 mg Subcutaneous Q24H  . nicotine  21 mg Transdermal Daily  . thiamine  100 mg Oral Daily   Or  . thiamine  100 mg Intravenous Daily   Continuous Infusions: . sodium chloride 125 mL/hr at 07/25/16 0116    Procedures/Studies: Dg Chest 2 View  Result Date: 07/10/2016 CLINICAL DATA:  Left-sided chest pain for 1 hour. Vomiting earlier today. EXAM: CHEST  2 VIEW COMPARISON:  None. FINDINGS: The cardiomediastinal contours are normal. The lungs are clear. Pulmonary vasculature is normal. No consolidation, pleural effusion, or pneumothorax. No acute osseous abnormalities are seen. IMPRESSION: No acute pulmonary process. Electronically Signed   By: Jeb Levering M.D.   On: 07/10/2016 02:37   Ct Head Wo Contrast  Result Date: 07/10/2016 CLINICAL DATA:  Headache. Possible seizure activity. The known fall or trauma. Initial encounter. EXAM: CT HEAD WITHOUT CONTRAST TECHNIQUE: Contiguous axial images were obtained from the base of the skull through the vertex without intravenous contrast. COMPARISON:  None. FINDINGS: There  is no evidence of acute cortical infarct, intracranial hemorrhage, mass, midline shift, or extra-axial fluid collection. The ventricles and sulci are normal. Orbits are unremarkable. The frontal and maxillary sinuses are hypoplastic. There is no evidence of acute inflammatory change in the sinuses or mastoid air cells. No acute osseous abnormality is identified. IMPRESSION: Unremarkable CT appearance of the brain. Electronically Signed   By: Logan Bores M.D.   On: 07/10/2016 11:05   US Scrotum  Result Date: 07/24/2016 CLINICAL DATA:  Right testicular pain. History of testicular cancer in 2009 EXAM: [place] TESTICLES TECHNIQUE: Complete ultrasound examination of the testicles, epididymis, and other scrotal structures was performed. Color and spectral Doppler ultrasound were also utilized to evaluate blood flow to the testicles. COMPARISON:  07/16/2010 FINDINGS: Right testicle Measurements: 48 x 21 x 28 mm. No mass or microlithiasis visualized. Expected vascularity. Left testicle Surgically absent Right epididymis:  Normal in size and vascularity. Left epididymis:  Surgically absent Hydrocele:  None visualized. Varicocele:  None visualized. Pulsed Doppler interrogation of the right testicle demonstrates normal low resistance arterial and venous waveforms bilaterally. IMPRESSION: 1. No explanation for pain. 2. Left orchiectomy. Electronically Signed   By: Monte Fantasia M.D.   On: 07/24/2016 19:54   [place] Art/ven Flow Abd Pelv Doppler  Result Date: 07/24/2016 CLINICAL DATA:  Right testicular pain. History of testicular cancer in 2009 EXAM: [place] TESTICLES TECHNIQUE: Complete ultrasound examination of the testicles, epididymis, and other scrotal structures was performed. Color and spectral Doppler ultrasound were also utilized to evaluate blood flow to the testicles. COMPARISON:  07/16/2010 FINDINGS: Right testicle Measurements: 48 x 21 x  28 mm. No mass or microlithiasis visualized. Expected vascularity. Left testicle Surgically absent Right epididymis:  Normal in size and vascularity. Left epididymis:  Surgically absent Hydrocele:  None visualized. Varicocele:  None visualized. Pulsed Doppler interrogation of the right testicle demonstrates normal low resistance arterial and venous waveforms bilaterally. IMPRESSION: 1. No explanation for pain. 2. Left orchiectomy. Electronically Signed   By: Monte Fantasia M.D.   On: 07/24/2016 19:54    Michelangelo Rindfleisch, DO  [facility] Pager [phone]  If 7PM-7AM, please contact night-coverage [email or web address] Password [email or web address] 07/25/2016, 9:52 AM   LOS: 1 day

## 2016-07-26 LAB — BASIC METABOLIC PANEL
Anion gap: 6 (ref 5–15)
BUN: 6 mg/dL (ref 6–20)
CALCIUM: 8.8 mg/dL — AB (ref 8.9–10.3)
CHLORIDE: 110 mmol/L (ref 101–111)
CO2: 22 mmol/L (ref 22–32)
CREATININE: 1.07 mg/dL (ref 0.61–1.24)
GFR calc Af Amer: >60 mL/min (ref >60)
GFR calc non Af Amer: >60 mL/min (ref >60)
GLUCOSE: 93 mg/dL (ref 65–99)
Potassium: 4 mmol/L (ref 3.5–5.1)
Sodium: 138 mmol/L (ref 135–145)

## 2016-07-26 LAB — MAGNESIUM: Magnesium: 2 mg/dL (ref 1.7–2.4)

## 2016-07-26 MED ORDER — CHLORDIAZEPOXIDE HCL 25 MG PO CAPS
50.0000 mg | ORAL_CAPSULE | Freq: Two times a day (BID) | ORAL | Status: DC
  Administered 2016-07-26 – 2016-07-27 (×4): 50 mg via ORAL
  Filled 2016-07-26 (×4): qty 2

## 2016-07-26 MED ORDER — CAMPHOR-MENTHOL 0.5-0.5 % EX LOTN
TOPICAL_LOTION | CUTANEOUS | Status: DC | PRN
  Filled 2016-07-26: qty 222

## 2016-07-26 MED ORDER — HALOPERIDOL LACTATE 5 MG/ML IJ SOLN
5.0000 mg | Freq: Four times a day (QID) | INTRAMUSCULAR | Status: DC | PRN
  Administered 2016-07-27: 5 mg via INTRAVENOUS
  Filled 2016-07-26 (×2): qty 1

## 2016-07-26 NOTE — Progress Notes (Signed)
Pt has orders to transfer. RN from receiving unit called to express concern over appropriateness of pt for transfer to that floor. This RN passed this information on to the charge RN along with the receiving units phone number.

## 2016-07-26 NOTE — Progress Notes (Addendum)
Paged by RN that pt claims hearing voices to tell him to end it all. -continue sitter -consulted psychiatry--called this pm -haldol prn -pt has had opioid and benzo seeking behavior throughout the hospitalization.  DTat

## 2016-07-26 NOTE — Progress Notes (Signed)
This RN called to pt room, pt c/o increased voices that are becoming more clear, pt obs to be clam sitting on bed request to have something to go to sleep, MD/N stated to keep pt stable and not to sedate him and not to let him leave, nursing will cont to monitor,

## 2016-07-26 NOTE — Progress Notes (Signed)
PROGRESS NOTE  Thomas Blake [id number] DOB: 24-Aug-1981 DOA: 07/24/2016 PCP: No PCP Per Patient  Brief History:  35 year old male with a history of alcohol drop seizure, alcohol abuse, cocaine abuse, tobacco abuse, testicular cancer presented with hallucinations and wanting help with detox. The patient left AGAINST MEDICAL ADVICE after a hospital stay from 07/10/2016 through 07/12/2016. Since admission, the patient was placed on Librium and alcohol withdrawal protocol requiring increasing amounts of lorazepam IV. He was started on intravenous fluids and monitored in the stepdown unit. The patient continues to drink with his last drink on the morning of 07/24/2016. He last snorted cocaine 3 days prior to admission.  Assessment/Plan: Alcohol withdrawal -wean Librium 50 mg to q 12 hours -Continue alcohol withdrawal protocol -07/26/2016 a.m.--patient appears very comfortable eating breakfast -HIS SUBJECTIVE COMPLAINTS ARE DRIVING UP HIS CIWA SCORES AND HIS OBJECTIVE FINDINGS DO NOT CORRELATE WITH SUCH A HIGH SCORE -Only one dose of ativan today -Continue thiamine  Polysubstance abuse -Including alcohol, tobacco, cocaine -Monitor for signs of withdrawal -NicoDerm patch -Cessation discussed  Testicular pain -Status post left orchiectomy -Scrotal ultrasound negative -improved with morphine  Hypokalemia -Replete -Check magnesium--2.0    Disposition Plan:   Home in 1-2 days; transfer to tele Family Communication:   Family at bedside  Consultants:none    Code Status:  FULL   DVT Prophylaxis:  [place] Lovenox   Subjective: Patient denies fevers, chills, headache, chest pain, dyspnea, vomiting, diarrhea, abdominal pain, dysuria, hematuria, hematochezia, and melena. C/o nausea without emesis   Objective: Vitals:   07/26/16 0000 07/26/16 0402 07/26/16 0700 07/26/16 1700  BP: 104/67 98/60 (!) 86/47 118/80  Pulse: (!) 58 (!) 55 65 79  Resp: 11 11 18 18     Temp: 98.3 F (36.8 C)  98.4 F (36.9 C) 98.1 F (36.7 C)  TempSrc: Oral Oral Oral Oral  SpO2: 96% 96% 97% 100%  Weight:      Height:        Intake/Output Summary (Last 24 hours) at 07/26/16 1828 Last data filed at 07/26/16 1300  Gross per 24 hour  Intake           1647.5 ml  Output              650 ml  Net            997.5 ml   Weight change:  Exam:   General:  Pt is alert, follows commands appropriately, not in acute distress  HEENT: No icterus, No thrush, No neck mass, [place]/AT  Cardiovascular: RRR, S1/S2, no rubs, no gallops  Respiratory: CTA bilaterally, no wheezing, no crackles, no rhonchi  Abdomen: Soft/+BS, non tender, non distended, no guarding  Extremities: No edema, No lymphangitis, No petechiae, No rashes, no synovitis   Data Reviewed: I have personally reviewed following labs and imaging studies Basic Metabolic Panel:  Recent Labs Lab 07/24/16 0450 07/25/16 0354 07/26/16 0326  NA 140 139 138  K 3.3* 3.3* 4.0  CL 109 112* 110  CO2 22 21* 22  GLUCOSE 95 101* 93  BUN 7 7 6   CREATININE 1.21 1.11 1.07  CALCIUM 8.8* 8.3* 8.8*  MG 2.0  --  2.0  PHOS 3.5  --   --    Liver Function Tests:  Recent Labs Lab 07/24/16 0450 07/25/16 0354  AST 19 20  ALT 23 21  ALKPHOS 76 66  BILITOT 0.6 0.8  PROT 5.9* 5.1*  ALBUMIN 3.6 2.9*  Recent Labs Lab 07/24/16 0450  LIPASE 35   No results for input(s): AMMONIA in the last 168 hours. Coagulation Profile: No results for input(s): INR, PROTIME in the last 168 hours. CBC:  Recent Labs Lab 07/24/16 0450 07/25/16 0354  WBC 9.3 5.3  NEUTROABS 6.4  --   HGB 14.9 14.0  HCT 44.5 42.8  MCV 90.8 92.2  PLT 258 207   Cardiac Enzymes: No results for input(s): CKTOTAL, CKMB, CKMBINDEX, TROPONINI in the last 168 hours. BNP: Invalid input(s): POCBNP CBG: No results for input(s): GLUCAP in the last 168 hours. HbA1C: No results for input(s): HGBA1C in the last 72 hours. Urine analysis:    Component  Value Date/Time   COLORURINE YELLOW 07/24/2016 0610   APPEARANCEUR CLEAR 07/24/2016 0610   LABSPEC 1.013 07/24/2016 0610   PHURINE 6.0 07/24/2016 0610   GLUCOSEU NEGATIVE 07/24/2016 0610   HGBUR NEGATIVE 07/24/2016 0610   BILIRUBINUR NEGATIVE 07/24/2016 0610   KETONESUR NEGATIVE 07/24/2016 0610   PROTEINUR NEGATIVE 07/24/2016 0610   UROBILINOGEN 0.2 07/16/2010 2240   NITRITE NEGATIVE 07/24/2016 0610   LEUKOCYTESUR NEGATIVE 07/24/2016 0610   Sepsis Labs: @LABRCNTIP (procalcitonin:4,lacticidven:4) ) Recent Results (from the past 240 hour(s))  MRSA PCR Screening     Status: None   Collection Time: 07/24/16 10:44 AM  Result Value Ref Range Status   MRSA by PCR NEGATIVE NEGATIVE Final    Comment:        The GeneXpert MRSA Assay (FDA approved for NASAL specimens only), is one component of a comprehensive MRSA colonization surveillance program. It is not intended to diagnose MRSA infection nor to guide or monitor treatment for MRSA infections.      Scheduled Meds: . chlordiazePOXIDE  50 mg Oral BID  . enoxaparin (LOVENOX) injection  40 mg Subcutaneous Q24H  . multivitamin with minerals  1 tablet Oral Daily  . nicotine  21 mg Transdermal Daily  . thiamine  100 mg Oral Daily   Continuous Infusions:   Procedures/Studies: Dg Chest 2 View  Result Date: 07/10/2016 CLINICAL DATA:  Left-sided chest pain for 1 hour. Vomiting earlier today. EXAM: CHEST  2 VIEW COMPARISON:  None. FINDINGS: The cardiomediastinal contours are normal. The lungs are clear. Pulmonary vasculature is normal. No consolidation, pleural effusion, or pneumothorax. No acute osseous abnormalities are seen. IMPRESSION: No acute pulmonary process. Electronically Signed   By: Jeb Levering M.D.   On: 07/10/2016 02:37   Ct Head Wo Contrast  Result Date: 07/10/2016 CLINICAL DATA:  Headache. Possible seizure activity. The known fall or trauma. Initial encounter. EXAM: CT HEAD WITHOUT CONTRAST TECHNIQUE: Contiguous  axial images were obtained from the base of the skull through the vertex without intravenous contrast. COMPARISON:  None. FINDINGS: There is no evidence of acute cortical infarct, intracranial hemorrhage, mass, midline shift, or extra-axial fluid collection. The ventricles and sulci are normal. Orbits are unremarkable. The frontal and maxillary sinuses are hypoplastic. There is no evidence of acute inflammatory change in the sinuses or mastoid air cells. No acute osseous abnormality is identified. IMPRESSION: Unremarkable CT appearance of the brain. Electronically Signed   By: Logan Bores M.D.   On: 07/10/2016 11:05   US Scrotum  Result Date: 07/24/2016 CLINICAL DATA:  Right testicular pain. History of testicular cancer in 2009 EXAM: [place] TESTICLES TECHNIQUE: Complete ultrasound examination of the testicles, epididymis, and other scrotal structures was performed. Color and spectral Doppler ultrasound were also utilized to evaluate blood flow to the testicles. COMPARISON:  07/16/2010 FINDINGS: Right testicle Measurements: 48 x 21 x 28 mm. No mass or microlithiasis visualized. Expected vascularity. Left testicle Surgically absent Right epididymis:  Normal in size and vascularity. Left epididymis:  Surgically absent Hydrocele:  None visualized. Varicocele:  None visualized. Pulsed Doppler interrogation of the right testicle demonstrates normal low resistance arterial and venous waveforms bilaterally. IMPRESSION: 1. No explanation for pain. 2. Left orchiectomy. Electronically Signed   By: Monte Fantasia M.D.   On: 07/24/2016 19:54   [place] Art/ven Flow Abd Pelv Doppler  Result Date: 07/24/2016 CLINICAL DATA:  Right testicular pain. History of testicular cancer in 2009 EXAM: [place] TESTICLES TECHNIQUE: Complete ultrasound examination of the testicles, epididymis, and other scrotal structures was performed. Color and spectral Doppler  ultrasound were also utilized to evaluate blood flow to the testicles. COMPARISON:  07/16/2010 FINDINGS: Right testicle Measurements: 48 x 21 x 28 mm. No mass or microlithiasis visualized. Expected vascularity. Left testicle Surgically absent Right epididymis:  Normal in size and vascularity. Left epididymis:  Surgically absent Hydrocele:  None visualized. Varicocele:  None visualized. Pulsed Doppler interrogation of the right testicle demonstrates normal low resistance arterial and venous waveforms bilaterally. IMPRESSION: 1. No explanation for pain. 2. Left orchiectomy. Electronically Signed   By: Monte Fantasia M.D.   On: 07/24/2016 19:54    Dequane Strahan, DO  [facility] Pager [phone]  If 7PM-7AM, please contact night-coverage [email or web address] Password [email or web address] 07/26/2016, 6:28 PM   LOS: 2 days

## 2016-07-27 DIAGNOSIS — E778 Other disorders of glycoprotein metabolism: Secondary | ICD-10-CM

## 2016-07-27 DIAGNOSIS — F10232 Alcohol dependence with withdrawal with perceptual disturbance: Secondary | ICD-10-CM

## 2016-07-27 DIAGNOSIS — F1410 Cocaine abuse, uncomplicated: Secondary | ICD-10-CM

## 2016-07-27 LAB — URINALYSIS, ROUTINE W REFLEX MICROSCOPIC
BILIRUBIN URINE: NEGATIVE
Glucose, UA: NEGATIVE mg/dL
HGB URINE DIPSTICK: NEGATIVE
Ketones, ur: NEGATIVE mg/dL
Leukocytes, UA: NEGATIVE
NITRITE: NEGATIVE
PH: 7 (ref 5.0–8.0)
Protein, ur: NEGATIVE mg/dL
SPECIFIC GRAVITY, URINE: 1.019 (ref 1.005–1.030)

## 2016-07-27 MED ORDER — MORPHINE SULFATE (PF) 2 MG/ML IV SOLN
1.0000 mg | INTRAVENOUS | Status: DC | PRN
  Administered 2016-07-27 – 2016-07-28 (×3): 2 mg via INTRAVENOUS
  Filled 2016-07-27 (×3): qty 1

## 2016-07-27 NOTE — Clinical Social Work Note (Signed)
Clinical Social Work Assessment  Patient Details  Name: Thomas Blake MRN: [id number] Date of Birth: [birth year]  Date of referral:  07/27/16               Reason for consult:  Substance Use/ETOH Abuse                Permission sought to share information with:  PCP, Psychiatrist Permission granted to share information::     Name::        Agency::     Relationship::     Contact Information:     Housing/Transportation Living arrangements for the past 2 months:  Single Family Home Source of Information:  Patient Patient Interpreter Needed:  None Criminal Activity/Legal Involvement Pertinent to Current Situation/Hospitalization:  No - Comment as needed Significant Relationships:  Dependent Children, Significant Other Lives with:  Self Do you feel safe going back to the place where you live?  Yes Need for family participation in patient care:  No (Coment)  Care giving concerns:  CSW received consult regarding ETOH abuse. Suicide sitter stepped outside of room during conversation. Patient was sleepy and would not keep his eyes open to speak with CSW and would only mumble his answers. Patient stated he drinks alcohol every day and does cocaine every so often socially. He stated he is in the hospital to cut back on his drinking for health reasons.    Social Worker assessment / plan:  CSW provided substance use resources.   Employment status:  Unemployed Forensic scientist:  Self Pay (Medicaid Pending) PT Recommendations:  Not assessed at this time Information / Referral to community resources:  Outpatient Substance Abuse Treatment Options, Residential Substance Abuse Treatment Options  Patient/Family's Response to care:  Patient acted very sleepy and did not seem to want to engage in conversation but accepted resources.  Patient/Family's Understanding of and Emotional Response to Diagnosis, Current Treatment, and Prognosis:  No questions or concerns.  Emotional  Assessment Appearance:  Appears stated age Attitude/Demeanor/Rapport:  Lethargic Affect (typically observed):  Quiet (Would never open his eyes to speak with CSW, would only mumble) Orientation:  Oriented to Self, Oriented to Place, Oriented to  Time, Oriented to Situation Alcohol / Substance use:  Alcohol Use, Illicit Drugs Psych involvement (Current and /or in the community):  Yes (Comment)  Discharge Needs  Concerns to be addressed:  Substance Abuse Concerns Readmission within the last 30 days:  Yes Current discharge risk:  None Barriers to Discharge:  Continued Medical Work up   [employer], [place] 07/27/2016, 10:36 AM
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
## 2016-07-27 NOTE — Progress Notes (Signed)
PROGRESS NOTE  Thomas Blake [id number] DOB: [birth year] DOA: 07/24/2016 PCP: No PCP Per Patient  Brief History:  35 year old male with a history of alcohol drop seizure, alcohol abuse, cocaine abuse, tobacco abuse, testicular cancer s/p orchiectomy presented with hallucinations and wanting help with detox. The patient left AGAINST MEDICAL ADVICE after a hospital stay from 07/10/2016 through 07/12/2016. Since admission to SDU, the patient was placed on Librium and alcohol withdrawal protocol requiring increasing amounts of lorazepam IV. He reports up to 3 gallons of vodka intake per day, last drink on the morning of 07/24/2016, also intermittently used cocaine PTA. He was transferred to the floor and librium decreased to 50mg  BID on 8/22. Psychiatry was consulted for hallucinations, thought to be purely related to substance withdrawal and no medications recommended.  Assessment/Plan: Alcohol withdrawal: Approaching 48 hrs after last drink with reports of hallucinosis without seizure-like activity (has history of withdrawal seizures). No evidence of DT's, VSS.  - CIWA scores remain elevated almost purely due to subjective complaints which do not correlate with objective findings (e.g. frequent somnolence, absence of tremor). Will continue to monitor CIWA.  - Librium weaned to 50 mg q12h on 8/22, plan to wean to 50mg  daily 8/24 based on ativan use over next 24 hours.  - Continue thiamine - Continue haldol prn hallucinations with agitation. Use this judiciously.  - Social work consult for substance abuse resources, possible treatment facility disposition.   Polysubstance abuse -Including alcohol, tobacco, cocaine -NicoDerm patch -Cessation discussed  Testicular pain: U/S negative. s/p left orchiectomy.  - Check UA and GC/chl cytology, ?epididymitis  - Wean morphine 8/23, transition to po 8/24  Hypokalemia: Resolved s/p repletion with normal Mg. - Monitor BMP, Mg  Disposition  Plan: Continue telemetry, anticipate D/C home vs. [facility] in 1-2 days Family Communication: None at bedside.   Consultants: psychiatry, Dr. Louretta Shorten  Code Status:  FULL   DVT Prophylaxis:  [place] Lovenox  Subjective: Primary complaint is ongoing right testicular pain and shooting pain into 2nd and 3rd toes of right foot intermittently waking him from sleep. Denies pruritus. Patient denies current SI/HI or hallucinations this morning. Still with nausea without emesis. Last drink 8/19, again endorses gallons of vodka per day and recent intermittent cocaine use.  Objective: Vitals:   07/26/16 2008 07/26/16 2009 07/26/16 2133 07/27/16 0558  BP:  129/76 120/75 100/62  Pulse:  77 75 68  Resp:  (!) 25 18 18   Temp: 97.4 F (36.3 C)  98.1 F (36.7 C) 97.8 F (36.6 C)  TempSrc: Oral  Oral Oral  SpO2:   99% 95%  Weight:      Height:        Intake/Output Summary (Last 24 hours) at 07/27/16 1623 Last data filed at 07/27/16 0913  Gross per 24 hour  Intake              350 ml  Output              600 ml  Net             -250 ml   Weight change:  Exam:   General:  Pt is sleeping, slow to wake up, follows commands appropriately, not in acute distress  HEENT: No icterus, No thrush, No neck mass, [place]/AT  Cardiovascular: RRR, S1/S2, no rubs, no gallops  Respiratory: CTA bilaterally, no wheezing, no crackles, no rhonchi  Abdomen: Soft/+BS, non tender, non distended, no guarding  GU: Tender right testicle without hydrocele or varicocele. No masses  palpated. Left testicle absent. No rashes.  Extremities: No edema, No lymphangitis, No petechiae, No rashes, no synovitis. Calluses on plantar 4th and 5th toes bilaterally with intact sensation, brisk cap refill.   Neuro: Pupils small, symmetrical, reactive. No dyskinesia or asterixis. Nonfocal. Fine active tremor not noted at rest. Normal gait and speech.   Data Reviewed: I have personally reviewed following labs and imaging  studies Basic Metabolic Panel:  Recent Labs Lab 07/24/16 0450 07/25/16 0354 07/26/16 0326  NA 140 139 138  K 3.3* 3.3* 4.0  CL 109 112* 110  CO2 22 21* 22  GLUCOSE 95 101* 93  BUN 7 7 6   CREATININE 1.21 1.11 1.07  CALCIUM 8.8* 8.3* 8.8*  MG 2.0  --  2.0  PHOS 3.5  --   --    Liver Function Tests:  Recent Labs Lab 07/24/16 0450 07/25/16 0354  AST 19 20  ALT 23 21  ALKPHOS 76 66  BILITOT 0.6 0.8  PROT 5.9* 5.1*  ALBUMIN 3.6 2.9*    Recent Labs Lab 07/24/16 0450  LIPASE 35   No results for input(s): AMMONIA in the last 168 hours. Coagulation Profile: No results for input(s): INR, PROTIME in the last 168 hours. CBC:  Recent Labs Lab 07/24/16 0450 07/25/16 0354  WBC 9.3 5.3  NEUTROABS 6.4  --   HGB 14.9 14.0  HCT 44.5 42.8  MCV 90.8 92.2  PLT 258 207   Cardiac Enzymes: No results for input(s): CKTOTAL, CKMB, CKMBINDEX, TROPONINI in the last 168 hours. BNP: Invalid input(s): POCBNP CBG: No results for input(s): GLUCAP in the last 168 hours. HbA1C: No results for input(s): HGBA1C in the last 72 hours. Urine analysis:    Component Value Date/Time   COLORURINE YELLOW 07/24/2016 0610   APPEARANCEUR CLEAR 07/24/2016 0610   LABSPEC 1.013 07/24/2016 0610   PHURINE 6.0 07/24/2016 0610   GLUCOSEU NEGATIVE 07/24/2016 0610   HGBUR NEGATIVE 07/24/2016 0610   BILIRUBINUR NEGATIVE 07/24/2016 0610   KETONESUR NEGATIVE 07/24/2016 0610   PROTEINUR NEGATIVE 07/24/2016 0610   UROBILINOGEN 0.2 07/16/2010 2240   NITRITE NEGATIVE 07/24/2016 0610   LEUKOCYTESUR NEGATIVE 07/24/2016 0610   Sepsis Labs: @LABRCNTIP (procalcitonin:4,lacticidven:4) ) Recent Results (from the past 240 hour(s))  MRSA PCR Screening     Status: None   Collection Time: 07/24/16 10:44 AM  Result Value Ref Range Status   MRSA by PCR NEGATIVE NEGATIVE Final    Comment:        The GeneXpert MRSA Assay (FDA approved for NASAL specimens only), is one component of a comprehensive MRSA  colonization surveillance program. It is not intended to diagnose MRSA infection nor to guide or monitor treatment for MRSA infections.      Scheduled Meds: . chlordiazePOXIDE  50 mg Oral BID  . enoxaparin (LOVENOX) injection  40 mg Subcutaneous Q24H  . multivitamin with minerals  1 tablet Oral Daily  . nicotine  21 mg Transdermal Daily  . thiamine  100 mg Oral Daily   Continuous Infusions:   Procedures/Studies: Dg Chest 2 View  Result Date: 07/10/2016 CLINICAL DATA:  Left-sided chest pain for 1 hour. Vomiting earlier today. EXAM: CHEST  2 VIEW COMPARISON:  None. FINDINGS: The cardiomediastinal contours are normal. The lungs are clear. Pulmonary vasculature is normal. No consolidation, pleural effusion, or pneumothorax. No acute osseous abnormalities are seen. IMPRESSION: No acute pulmonary process. Electronically Signed   By: Jeb Levering M.D.   On: 07/10/2016 02:37   Ct Head Wo Contrast  Result  Date: 07/10/2016 CLINICAL DATA:  Headache. Possible seizure activity. The known fall or trauma. Initial encounter. EXAM: CT HEAD WITHOUT CONTRAST TECHNIQUE: Contiguous axial images were obtained from the base of the skull through the vertex without intravenous contrast. COMPARISON:  None. FINDINGS: There is no evidence of acute cortical infarct, intracranial hemorrhage, mass, midline shift, or extra-axial fluid collection. The ventricles and sulci are normal. Orbits are unremarkable. The frontal and maxillary sinuses are hypoplastic. There is no evidence of acute inflammatory change in the sinuses or mastoid air cells. No acute osseous abnormality is identified. IMPRESSION: Unremarkable CT appearance of the brain. Electronically Signed   By: Logan Bores M.D.   On: 07/10/2016 11:05   US Scrotum  Result Date: 07/24/2016 CLINICAL DATA:  Right testicular pain. History of testicular cancer in 2009 EXAM: [place] TESTICLES TECHNIQUE: Complete ultrasound  examination of the testicles, epididymis, and other scrotal structures was performed. Color and spectral Doppler ultrasound were also utilized to evaluate blood flow to the testicles. COMPARISON:  07/16/2010 FINDINGS: Right testicle Measurements: 48 x 21 x 28 mm. No mass or microlithiasis visualized. Expected vascularity. Left testicle Surgically absent Right epididymis:  Normal in size and vascularity. Left epididymis:  Surgically absent Hydrocele:  None visualized. Varicocele:  None visualized. Pulsed Doppler interrogation of the right testicle demonstrates normal low resistance arterial and venous waveforms bilaterally. IMPRESSION: 1. No explanation for pain. 2. Left orchiectomy. Electronically Signed   By: Monte Fantasia M.D.   On: 07/24/2016 19:54   [place] Art/ven Flow Abd Pelv Doppler  Result Date: 07/24/2016 CLINICAL DATA:  Right testicular pain. History of testicular cancer in 2009 EXAM: [place] TESTICLES TECHNIQUE: Complete ultrasound examination of the testicles, epididymis, and other scrotal structures was performed. Color and spectral Doppler ultrasound were also utilized to evaluate blood flow to the testicles. COMPARISON:  07/16/2010 FINDINGS: Right testicle Measurements: 48 x 21 x 28 mm. No mass or microlithiasis visualized. Expected vascularity. Left testicle Surgically absent Right epididymis:  Normal in size and vascularity. Left epididymis:  Surgically absent Hydrocele:  None visualized. Varicocele:  None visualized. Pulsed Doppler interrogation of the right testicle demonstrates normal low resistance arterial and venous waveforms bilaterally. IMPRESSION: 1. No explanation for pain. 2. Left orchiectomy. Electronically Signed   By: Monte Fantasia M.D.   On: 07/24/2016 19:54   Vance Gather, MD  [facility] Pager [phone]  If 7PM-7AM, please contact night-coverage [email or web address] Password [email or web address] 07/27/2016, 4:23 PM   LOS: 3 days

## 2016-07-27 NOTE — Care Management Note (Addendum)
Case Management Note  Patient Details  Name: Thomas Blake MRN: [id number] Date of Birth: 1981-03-12  Subjective/Objective:                 Patient admitted with ETOH withdraw. Patient from home. Transferred from [facility]. Patient endorses cocaine use and 1/2 to 3 gallons of vodka a day per various notes in chart. UDS + cocaine and THC. Patient has sitter for suicide precautions, states he was hallucinating yesterday. CSW consult obtained. Patient had appointment last DC w/ [facility] and did not show, patient has history of leaving AMA. Psych consult ordered per nursing staff.   Action/Plan:  Addendum 08/02/16 Patient to call and schedule follow up with [facility]. He is aware if he no shows for appointment again they may be unwilling to see him again and fill Rx in future.   Expected Discharge Date:                  Expected Discharge Plan:   (to be determined)  In-House Referral:  Clinical Social Work  Discharge planning Services  CM Consult  Post Acute Care Choice:    Choice offered to:     DME Arranged:    DME Agency:     HH Arranged:    [place] Agency:     Status of Service:  In process, will continue to follow  If discussed at Long Length of Stay Meetings, dates discussed:    Additional Comments:  Carles Collet, RN 07/27/2016, 11:30 AM

## 2016-07-28 LAB — BASIC METABOLIC PANEL
Anion gap: 7 (ref 5–15)
BUN: 9 mg/dL (ref 6–20)
CALCIUM: 9 mg/dL (ref 8.9–10.3)
CO2: 25 mmol/L (ref 22–32)
Chloride: 105 mmol/L (ref 101–111)
Creatinine, Ser: 1.08 mg/dL (ref 0.61–1.24)
GFR calc Af Amer: >60 mL/min (ref >60)
GLUCOSE: 92 mg/dL (ref 65–99)
Potassium: 3.9 mmol/L (ref 3.5–5.1)
Sodium: 137 mmol/L (ref 135–145)

## 2016-07-28 LAB — MAGNESIUM: Magnesium: 2 mg/dL (ref 1.7–2.4)

## 2016-07-28 MED ORDER — TRAZODONE HCL 100 MG PO TABS
100.0000 mg | ORAL_TABLET | Freq: Every day | ORAL | Status: DC
  Administered 2016-07-28 – 2016-08-01 (×5): 100 mg via ORAL
  Filled 2016-07-28 (×6): qty 1

## 2016-07-28 MED ORDER — CHLORDIAZEPOXIDE HCL 25 MG PO CAPS
25.0000 mg | ORAL_CAPSULE | Freq: Every day | ORAL | Status: AC
  Administered 2016-07-29: 25 mg via ORAL
  Filled 2016-07-28: qty 1

## 2016-07-28 MED ORDER — CHLORDIAZEPOXIDE HCL 25 MG PO CAPS: 25.0000 mg | ORAL_CAPSULE | Freq: Two times a day (BID) | ORAL | Status: DC

## 2016-07-28 MED ORDER — ROPINIROLE HCL 1 MG PO TABS
1.0000 mg | ORAL_TABLET | Freq: Three times a day (TID) | ORAL | Status: DC
  Administered 2016-07-28: 1 mg via ORAL
  Filled 2016-07-28: qty 1

## 2016-07-28 MED ORDER — HALOPERIDOL LACTATE 5 MG/ML IJ SOLN: 2.5000 mg | Freq: Four times a day (QID) | INTRAMUSCULAR | Status: DC | PRN

## 2016-07-28 MED ORDER — OXYCODONE HCL 5 MG PO TABS
5.0000 mg | ORAL_TABLET | ORAL | Status: DC | PRN
  Administered 2016-07-28 – 2016-07-31 (×8): 5 mg via ORAL
  Filled 2016-07-28 (×8): qty 1

## 2016-07-28 MED ORDER — CHLORDIAZEPOXIDE HCL 25 MG PO CAPS
25.0000 mg | ORAL_CAPSULE | Freq: Two times a day (BID) | ORAL | Status: AC
  Administered 2016-07-28 (×2): 25 mg via ORAL
  Filled 2016-07-28 (×2): qty 1
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
## 2016-07-28 NOTE — Progress Notes (Signed)
PROGRESS NOTE  Thomas Blake [id number] DOB: [birth year] DOA: 07/24/2016 PCP: No PCP Per Patient  Brief History:  35 year old male with a history of alcohol drop seizure, alcohol abuse, cocaine abuse, tobacco abuse, testicular cancer s/p orchiectomy presented with hallucinations and wanting help with detox. The patient left AGAINST MEDICAL ADVICE after a hospital stay from 07/10/2016 through 07/12/2016. Since admission to SDU, the patient was placed on Librium and alcohol withdrawal protocol requiring increasing amounts of lorazepam IV. He reports up to 3 gallons of vodka intake per day, last drink on the morning of 07/24/2016, also intermittently used cocaine PTA. He was transferred to the floor and librium decreased to 50mg  BID on 8/22, 25mg  BID 8/24. Psychiatry was consulted for hallucinations, thought to be purely related to substance withdrawal and no medications recommended.  Assessment/Plan: Alcohol withdrawal: Approaching 48 hrs after last drink with reports of hallucinosis without seizure-like activity (has history of withdrawal seizures). No evidence of DT's, VSS.  - CIWA scores remain elevated mostly due to subjective complaints which do not correlate with objective findings (e.g. frequent somnolence, absence of tremor). Will continue to monitor CIWA.  - Continue wean of librium: 25mg  BID 8/23. Continue based on ativan use over next 24 hours.  - Continue thiamine - Continue haldol prn agitation, lower dose. Use this judiciously.  - Social work consult for substance abuse resources, possible treatment facility disposition.   Polysubstance abuse - Including alcohol, tobacco, cocaine - NicoDerm patch - Cessation discussed - CSW assistance appreciated  Testicular pain: U/S negative. Chronic, intermittent s/p left orchiectomy.  - Neg UA and pending GC/chl. - Wean morphine 8/23, transition to po 8/24  Hypokalemia: Resolved s/p repletion with normal Mg. - Monitor as  indicated  Disposition Plan: Continue telemetry, anticipate D/C with [facility] shelter 8/28  Family Communication: None at bedside.   Consultants: psychiatry, Dr. Louretta Shorten  Code Status:  FULL   DVT Prophylaxis:  [place] Lovenox  Subjective: Pt reports improved testicular pain which has been intermittent every few months since left testicle was removed. Foot pain resolved as well. He had visual hallucinations last night, his dead brother appeared, but no auditory hallucinations. No SI/HI. Last drink 8/19, again endorses gallons of vodka per day and recent intermittent cocaine use.  Objective: Vitals:   07/27/16 0558 07/27/16 1730 07/27/16 2050 07/28/16 0504  BP: 100/62 111/65 123/61 109/61  Pulse: 68 86 76 87  Resp: 18 20 17 16   Temp: 97.8 F (36.6 C) 97.5 F (36.4 C) 97.8 F (36.6 C) 97.6 F (36.4 C)  TempSrc: Oral   Oral  SpO2: 95% 99% 99% 97%  Weight:      Height:        Intake/Output Summary (Last 24 hours) at 07/28/16 1340 Last data filed at 07/27/16 2050  Gross per 24 hour  Intake              240 ml  Output              150 ml  Net               90 ml   Weight change:  Exam:   General:  Pt is sleeping, slow to wake up, follows commands appropriately, not in acute distress  HEENT: No icterus, No thrush, No neck mass, [place]/AT  Cardiovascular: RRR, S1/S2, no rubs, no gallops  Respiratory: CTA bilaterally, no wheezing, no crackles, no rhonchi  Abdomen: Soft/+BS, non tender, non distended, no guarding  Extremities:  No edema, No lymphangitis, No petechiae, No rashes, no synovitis. Calluses on plantar 4th and 5th toes bilaterally with intact sensation, brisk cap refill.   Neuro: Pupils small, symmetrical, reactive. No dyskinesia or asterixis. Nonfocal. Fine active tremor not noted at rest. Normal gait and speech.   Data Reviewed: I have personally reviewed following labs and imaging studies Basic Metabolic Panel:  Recent Labs Lab  07/24/16 0450 07/25/16 0354 07/26/16 0326 07/28/16 0546  NA 140 139 138 137  K 3.3* 3.3* 4.0 3.9  CL 109 112* 110 105  CO2 22 21* 22 25  GLUCOSE 95 101* 93 92  BUN 7 7 6 9   CREATININE 1.21 1.11 1.07 1.08  CALCIUM 8.8* 8.3* 8.8* 9.0  MG 2.0  --  2.0 2.0  PHOS 3.5  --   --   --    Liver Function Tests:  Recent Labs Lab 07/24/16 0450 07/25/16 0354  AST 19 20  ALT 23 21  ALKPHOS 76 66  BILITOT 0.6 0.8  PROT 5.9* 5.1*  ALBUMIN 3.6 2.9*    Recent Labs Lab 07/24/16 0450  LIPASE 35   No results for input(s): AMMONIA in the last 168 hours. Coagulation Profile: No results for input(s): INR, PROTIME in the last 168 hours. CBC:  Recent Labs Lab 07/24/16 0450 07/25/16 0354  WBC 9.3 5.3  NEUTROABS 6.4  --   HGB 14.9 14.0  HCT 44.5 42.8  MCV 90.8 92.2  PLT 258 207   Cardiac Enzymes: No results for input(s): CKTOTAL, CKMB, CKMBINDEX, TROPONINI in the last 168 hours. BNP: Invalid input(s): POCBNP CBG: No results for input(s): GLUCAP in the last 168 hours. HbA1C: No results for input(s): HGBA1C in the last 72 hours. Urine analysis:    Component Value Date/Time   COLORURINE YELLOW 07/27/2016 2152   APPEARANCEUR CLEAR 07/27/2016 2152   LABSPEC 1.019 07/27/2016 2152   PHURINE 7.0 07/27/2016 2152   GLUCOSEU NEGATIVE 07/27/2016 2152   HGBUR NEGATIVE 07/27/2016 2152   BILIRUBINUR NEGATIVE 07/27/2016 2152   KETONESUR NEGATIVE 07/27/2016 2152   PROTEINUR NEGATIVE 07/27/2016 2152   UROBILINOGEN 0.2 07/16/2010 2240   NITRITE NEGATIVE 07/27/2016 2152   LEUKOCYTESUR NEGATIVE 07/27/2016 2152   Sepsis Labs: @LABRCNTIP (procalcitonin:4,lacticidven:4) ) Recent Results (from the past 240 hour(s))  MRSA PCR Screening     Status: None   Collection Time: 07/24/16 10:44 AM  Result Value Ref Range Status   MRSA by PCR NEGATIVE NEGATIVE Final    Comment:        The GeneXpert MRSA Assay (FDA approved for NASAL specimens only), is one component of a comprehensive MRSA  colonization surveillance program. It is not intended to diagnose MRSA infection nor to guide or monitor treatment for MRSA infections.      Scheduled Meds: . chlordiazePOXIDE  25 mg Oral BID   Followed by  . [START ON 07/29/2016] chlordiazePOXIDE  25 mg Oral Daily  . enoxaparin (LOVENOX) injection  40 mg Subcutaneous Q24H  . multivitamin with minerals  1 tablet Oral Daily  . nicotine  21 mg Transdermal Daily  . thiamine  100 mg Oral Daily   Continuous Infusions:   Procedures/Studies: Dg Chest 2 View  Result Date: 07/10/2016 CLINICAL DATA:  Left-sided chest pain for 1 hour. Vomiting earlier today. EXAM: CHEST  2 VIEW COMPARISON:  None. FINDINGS: The cardiomediastinal contours are normal. The lungs are clear. Pulmonary vasculature is normal. No consolidation, pleural effusion, or pneumothorax. No acute osseous abnormalities are seen. IMPRESSION: No acute pulmonary process. Electronically  Signed   By: Jeb Levering M.D.   On: 07/10/2016 02:37   Ct Head Wo Contrast  Result Date: 07/10/2016 CLINICAL DATA:  Headache. Possible seizure activity. The known fall or trauma. Initial encounter. EXAM: CT HEAD WITHOUT CONTRAST TECHNIQUE: Contiguous axial images were obtained from the base of the skull through the vertex without intravenous contrast. COMPARISON:  None. FINDINGS: There is no evidence of acute cortical infarct, intracranial hemorrhage, mass, midline shift, or extra-axial fluid collection. The ventricles and sulci are normal. Orbits are unremarkable. The frontal and maxillary sinuses are hypoplastic. There is no evidence of acute inflammatory change in the sinuses or mastoid air cells. No acute osseous abnormality is identified. IMPRESSION: Unremarkable CT appearance of the brain. Electronically Signed   By: Logan Bores M.D.   On: 07/10/2016 11:05   US Scrotum  Result Date: 07/24/2016 CLINICAL DATA:  Right testicular pain. History of testicular cancer in 2009 EXAM: [place] TESTICLES TECHNIQUE: Complete ultrasound examination of the testicles, epididymis, and other scrotal structures was performed. Color and spectral Doppler ultrasound were also utilized to evaluate blood flow to the testicles. COMPARISON:  07/16/2010 FINDINGS: Right testicle Measurements: 48 x 21 x 28 mm. No mass or microlithiasis visualized. Expected vascularity. Left testicle Surgically absent Right epididymis:  Normal in size and vascularity. Left epididymis:  Surgically absent Hydrocele:  None visualized. Varicocele:  None visualized. Pulsed Doppler interrogation of the right testicle demonstrates normal low resistance arterial and venous waveforms bilaterally. IMPRESSION: 1. No explanation for pain. 2. Left orchiectomy. Electronically Signed   By: Monte Fantasia M.D.   On: 07/24/2016 19:54   [place] Art/ven Flow Abd Pelv Doppler  Result Date: 07/24/2016 CLINICAL DATA:  Right testicular pain. History of testicular cancer in 2009 EXAM: [place] TESTICLES TECHNIQUE: Complete ultrasound examination of the testicles, epididymis, and other scrotal structures was performed. Color and spectral Doppler ultrasound were also utilized to evaluate blood flow to the testicles. COMPARISON:  07/16/2010 FINDINGS: Right testicle Measurements: 48 x 21 x 28 mm. No mass or microlithiasis visualized. Expected vascularity. Left testicle Surgically absent Right epididymis:  Normal in size and vascularity. Left epididymis:  Surgically absent Hydrocele:  None visualized. Varicocele:  None visualized. Pulsed Doppler interrogation of the right testicle demonstrates normal low resistance arterial and venous waveforms bilaterally. IMPRESSION: 1. No explanation for pain. 2. Left orchiectomy. Electronically Signed   By: Monte Fantasia M.D.   On: 07/24/2016 19:54   Vance Gather, MD  [facility] Pager [phone]  If 7PM-7AM, please contact  night-coverage [email or web address] Password [email or web address] 07/28/2016, 1:40 PM   LOS: 4 days

## 2016-07-29 DIAGNOSIS — Z720 Tobacco use: Secondary | ICD-10-CM

## 2016-07-29 LAB — URINE CULTURE: Culture: <10000 — AB

## 2016-07-29 MED ORDER — LORAZEPAM 2 MG/ML IJ SOLN: 0.0000 mg | Freq: Two times a day (BID) | INTRAMUSCULAR | Status: DC

## 2016-07-29 MED ORDER — THIAMINE HCL 100 MG/ML IJ SOLN: 100.0000 mg | Freq: Every day | INTRAMUSCULAR | Status: DC

## 2016-07-29 MED ORDER — LORAZEPAM 1 MG PO TABS
0.0000 mg | ORAL_TABLET | Freq: Two times a day (BID) | ORAL | Status: DC
Start: 2016-08-01 — End: 2016-08-02
  Administered 2016-08-01: 2 mg via ORAL
  Administered 2016-08-02: 1 mg via ORAL
  Filled 2016-07-29: qty 1
  Filled 2016-07-29: qty 2

## 2016-07-29 MED ORDER — LORAZEPAM 2 MG/ML IJ SOLN
1.0000 mg | Freq: Four times a day (QID) | INTRAMUSCULAR | Status: DC | PRN
  Filled 2016-07-29 (×2): qty 1

## 2016-07-29 MED ORDER — ROPINIROLE HCL 0.25 MG PO TABS
0.2500 mg | ORAL_TABLET | Freq: Three times a day (TID) | ORAL | Status: DC
  Administered 2016-07-29 – 2016-08-02 (×13): 0.25 mg via ORAL
  Filled 2016-07-29 (×14): qty 1

## 2016-07-29 MED ORDER — CHLORDIAZEPOXIDE HCL 5 MG PO CAPS: 10.0000 mg | ORAL_CAPSULE | Freq: Once | ORAL | Status: DC

## 2016-07-29 MED ORDER — CHLORDIAZEPOXIDE HCL 5 MG PO CAPS: 5.0000 mg | ORAL_CAPSULE | Freq: Once | ORAL | Status: DC

## 2016-07-29 MED ORDER — LORAZEPAM 1 MG PO TABS
1.0000 mg | ORAL_TABLET | Freq: Four times a day (QID) | ORAL | Status: DC | PRN
  Administered 2016-07-29 (×2): 1 mg via ORAL
  Filled 2016-07-29 (×2): qty 1

## 2016-07-29 MED ORDER — FOLIC ACID 1 MG PO TABS
1.0000 mg | ORAL_TABLET | Freq: Every day | ORAL | Status: DC
  Administered 2016-07-30 – 2016-08-02 (×4): 1 mg via ORAL
  Filled 2016-07-29 (×4): qty 1

## 2016-07-29 MED ORDER — SODIUM CHLORIDE 0.9 % IV BOLUS (SEPSIS): 500.0000 mL | Freq: Once | INTRAVENOUS | Status: DC

## 2016-07-29 MED ORDER — VITAMIN B-1 100 MG PO TABS: 100.0000 mg | ORAL_TABLET | Freq: Every day | ORAL | Status: DC

## 2016-07-29 MED ORDER — LORAZEPAM 2 MG/ML IJ SOLN: 0.0000 mg | Freq: Four times a day (QID) | INTRAMUSCULAR | Status: DC

## 2016-07-29 MED ORDER — LORAZEPAM 1 MG PO TABS
0.0000 mg | ORAL_TABLET | Freq: Four times a day (QID) | ORAL | Status: DC
  Administered 2016-07-30 (×4): 4 mg via ORAL
  Administered 2016-07-31: 2 mg via ORAL
  Administered 2016-07-31 (×2): 1 mg via ORAL
  Filled 2016-07-29 (×2): qty 4
  Filled 2016-07-29: qty 1
  Filled 2016-07-29 (×2): qty 4
  Filled 2016-07-29: qty 2

## 2016-07-29 MED ORDER — ADULT MULTIVITAMIN W/MINERALS CH: 1.0000 | ORAL_TABLET | Freq: Every day | ORAL | Status: DC

## 2016-07-29 MED ORDER — LORAZEPAM 2 MG/ML IJ SOLN: 1.0000 mg | Freq: Four times a day (QID) | INTRAMUSCULAR | Status: DC | PRN

## 2016-07-29 MED ORDER — LORAZEPAM 1 MG PO TABS
1.0000 mg | ORAL_TABLET | Freq: Four times a day (QID) | ORAL | Status: DC | PRN
  Administered 2016-07-31 – 2016-08-01 (×2): 1 mg via ORAL
  Filled 2016-07-29 (×3): qty 1

## 2016-07-29 NOTE — Progress Notes (Signed)
Nutrition Follow-up  DOCUMENTATION CODES:   Severe malnutrition in context of acute illness/injury  INTERVENTION:   -Continue snacks TID -Continue MVI daily  NUTRITION DIAGNOSIS:   Malnutrition (Severe) related to acute illness (alcohol abuse) as evidenced by energy intake < or equal to 50% for > or equal to 5 days, percent weight loss.  Progressing  GOAL:   Patient will meet greater than or equal to 90% of their needs  Met  MONITOR:   PO intake, Labs, I & O's  REASON FOR ASSESSMENT:   Consult Assessment of nutrition requirement/status  ASSESSMENT:   35 y.o. M with history of cocaine abuse and alcohol abuse, presented with complaints of withdrawal and hallucinations. Also has history of seizures in setting of alcohol withdrawal.   Pt transferred from SDU to medical floor on 07/26/16.   Pt sleeping soundly at time of visit. RD did not wake. Pt continues to have good appetite. Noted meal completion 80-100%.   Medication reviewed and include MVI and thiamine.   CSW following. Plan to d/c to shelter vs residential substance abuse treatment center.   Labs reviewed.   Diet Order:  Diet regular Room service appropriate? Yes; Fluid consistency: Thin  Skin:  Wound (see comment) (Abrasion right posterior heel)  Last BM:  07/28/16  Height:   Ht Readings from Last 1 Encounters:  07/24/16 _0  (1.778 m)    Weight:   Wt Readings from Last 1 Encounters:  07/24/16 182 lb 5.1 oz (82.7 kg)    Ideal Body Weight:  75.5 kg  BMI:  Body mass index is 26.16 kg/m.  Estimated Nutritional Needs:   Kcal:  9794-8016  Protein:  100-115 grams  Fluid:  >/= 2.2 L/day  EDUCATION NEEDS:   Education needs addressed (Importance of adequate protein and calories at meals and snacks.)  Bernardino Dowell A. Jimmye Norman, RD, LDN, CDE Pager: [phone] After hours Pager: [phone]

## 2016-07-29 NOTE — Progress Notes (Signed)
PROGRESS NOTE  Thomas Blake [id number] DOB: March 22, 1981 DOA: 07/24/2016 PCP: No PCP Per Patient  Brief History:  35 year old male with a history of alcohol drop seizure, alcohol abuse, cocaine abuse, tobacco abuse, testicular cancer s/p orchiectomy presented with hallucinations and wanting help with detox. The patient left AGAINST MEDICAL ADVICE after a hospital stay from 07/10/2016 through 07/12/2016. Since admission to SDU, the patient was placed on Librium and alcohol withdrawal protocol requiring increasing amounts of lorazepam IV. He reports up to 3 gallons of vodka intake per day, last drink on the morning of 07/24/2016, also intermittently used cocaine PTA. He was transferred to the floor and librium decreased to 50mg  BID on 8/22, 25mg  BID 8/24. Psychiatry was consulted for hallucinations, thought to be purely related to substance withdrawal and no medications recommended. Tapering of librium has continued, though consumption of ativan has not decreased appreciably, primarily for subjective complaints.  Assessment/Plan: Alcohol withdrawal: Approaching 48 hrs after last drink with reports of hallucinosis without seizure-like activity (has history of withdrawal seizures). No evidence of DT's, VSS.  - CIWA scores remain elevated mostly due to subjective complaints which do not correlate with objective findings (e.g. frequent somnolence, tremor only present upon suggestion). Will continue to monitor CIWA.  - Continue wean of librium: 25mg  daily > 10 mg daily > 5 mg daily. Decreased ativan sliding scale (was still receiving step down doses) and will likely change to po in the next 24 - 48 hours.   - Continue thiamine, MVM - Continue haldol prn agitation, lower dose. Use this judiciously.  - Social work consult for substance abuse resources, possible treatment facility disposition.  - Visual hallucinations may be related to PTSD vs. grief reaction to brother's death in war, and therefore  may require outpatient psychiatric care.   Polysubstance abuse: Including alcohol, tobacco, cocaine - NicoDerm patch - Cessation discussed - CSW assistance appreciated  Testicular pain: U/S negative. 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  - Neg UA and pending GC/chl. - Wean morphine 8/23, transition to po 8/24  Hypokalemia: Resolved s/p repletion with normal Mg. - Monitor as indicated  Restless leg syndrome: Chronic condition, has been off requip for 1 week, usually takes 3mg  TID.  - Reduced does restarted (1mg  TID) with hypotensive response to first dose, so returned to 0.25mg  TID. Will continue to monitor symptoms and BP.   Insomnia: Chronic condition, also worsened by withdrawal symptoms.  - Restart home trazodone  Disposition Plan: Continue telemetry, anticipate D/C [facility]  Family Communication: None at bedside.   Consultants: psychiatry, Dr. Louretta Shorten  Code Status:  FULL   DVT Prophylaxis:  [place] Lovenox  Subjective: No current testicular pain. He again reports visual hallucinations last night, his dead brother appeared, but no auditory hallucinations. No SI/HI. Perseverates on dose and frequency of ativan.   Objective: Vitals:   07/28/16 1342 07/28/16 2000 07/29/16 0614 07/29/16 1351  BP: 124/67 (!) 147/73 (!) 80/41 (!) 113/50  Pulse: 91 90 63 76  Resp: 19  16 16   Temp: 97.6 F (36.4 C)  98.3 F (36.8 C) 97.4 F (36.3 C)  TempSrc:   Oral Oral  SpO2: 100%  95% 96%  Weight:      Height:        Intake/Output Summary (Last 24 hours) at 07/29/16 1645 Last data filed at 07/29/16 1414  Gross per 24 hour  Intake  940 ml  Output              550 ml  Net              390 ml   Weight change:  Exam:   General:  Pt is walking about room in no distress. Not tremulous. No diaphoresis.  HEENT: No icterus, No thrush, No neck mass,  [place]/AT  Cardiovascular: RRR, S1/S2, no rubs, no gallops  Respiratory: CTA bilaterally, no wheezing, no crackles, no rhonchi  Abdomen: Soft/+BS, non tender, non distended, no guarding  Extremities: No edema, No lymphangitis, No petechiae, No rashes, no synovitis. Calluses on plantar 4th and 5th toes bilaterally with intact sensation, brisk cap refill.   Neuro: Pupils small, symmetrical, reactive. Slight bilateral ptosis that resolves voluntarily when asked. No dyskinesia or asterixis. Nonfocal. Normal gait and speech.   Data Reviewed: I have personally reviewed following labs and imaging studies Basic Metabolic Panel:  Recent Labs Lab 07/24/16 0450 07/25/16 0354 07/26/16 0326 07/28/16 0546  NA 140 139 138 137  K 3.3* 3.3* 4.0 3.9  CL 109 112* 110 105  CO2 22 21* 22 25  GLUCOSE 95 101* 93 92  BUN 7 7 6 9   CREATININE 1.21 1.11 1.07 1.08  CALCIUM 8.8* 8.3* 8.8* 9.0  MG 2.0  --  2.0 2.0  PHOS 3.5  --   --   --    Liver Function Tests:  Recent Labs Lab 07/24/16 0450 07/25/16 0354  AST 19 20  ALT 23 21  ALKPHOS 76 66  BILITOT 0.6 0.8  PROT 5.9* 5.1*  ALBUMIN 3.6 2.9*    Recent Labs Lab 07/24/16 0450  LIPASE 35   No results for input(s): AMMONIA in the last 168 hours. Coagulation Profile: No results for input(s): INR, PROTIME in the last 168 hours. CBC:  Recent Labs Lab 07/24/16 0450 07/25/16 0354  WBC 9.3 5.3  NEUTROABS 6.4  --   HGB 14.9 14.0  HCT 44.5 42.8  MCV 90.8 92.2  PLT 258 207   Cardiac Enzymes: No results for input(s): CKTOTAL, CKMB, CKMBINDEX, TROPONINI in the last 168 hours. BNP: Invalid input(s): POCBNP CBG: No results for input(s): GLUCAP in the last 168 hours. HbA1C: No results for input(s): HGBA1C in the last 72 hours. Urine analysis:    Component Value Date/Time   COLORURINE YELLOW 07/27/2016 2152   APPEARANCEUR CLEAR 07/27/2016 2152   LABSPEC 1.019 07/27/2016 2152   PHURINE 7.0 07/27/2016 2152   GLUCOSEU NEGATIVE 07/27/2016  2152   HGBUR NEGATIVE 07/27/2016 2152   BILIRUBINUR NEGATIVE 07/27/2016 2152   KETONESUR NEGATIVE 07/27/2016 2152   PROTEINUR NEGATIVE 07/27/2016 2152   UROBILINOGEN 0.2 07/16/2010 2240   NITRITE NEGATIVE 07/27/2016 2152   LEUKOCYTESUR NEGATIVE 07/27/2016 2152   Sepsis Labs: @LABRCNTIP (procalcitonin:4,lacticidven:4) ) Recent Results (from the past 240 hour(s))  MRSA PCR Screening     Status: None   Collection Time: 07/24/16 10:44 AM  Result Value Ref Range Status   MRSA by PCR NEGATIVE NEGATIVE Final    Comment:        The GeneXpert MRSA Assay (FDA approved for NASAL specimens only), is one component of a comprehensive MRSA colonization surveillance program. It is not intended to diagnose MRSA infection nor to guide or monitor treatment for MRSA infections.   Urine culture     Status: Abnormal   Collection Time: 07/27/16  9:52 PM  Result Value Ref Range Status   Specimen Description URINE, RANDOM  Final  Special Requests NONE  Final   Culture <10,000 COLONIES/mL INSIGNIFICANT GROWTH (A)  Final   Report Status 07/29/2016 FINAL  Final     Scheduled Meds: . enoxaparin (LOVENOX) injection  40 mg Subcutaneous Q24H  . LORazepam  0-4 mg Intravenous Q6H   Followed by  . [START ON 07/31/2016] LORazepam  0-4 mg Intravenous Q12H  . multivitamin with minerals  1 tablet Oral Daily  . nicotine  21 mg Transdermal Daily  . rOPINIRole  0.25 mg Oral TID  . sodium chloride  500 mL Intravenous Once  . thiamine  100 mg Oral Daily  . traZODone  100 mg Oral QHS   Continuous Infusions:   Procedures/Studies: Dg Chest 2 View  Result Date: 07/10/2016 CLINICAL DATA:  Left-sided chest pain for 1 hour. Vomiting earlier today. EXAM: CHEST  2 VIEW COMPARISON:  None. FINDINGS: The cardiomediastinal contours are normal. The lungs are clear. Pulmonary vasculature is normal. No consolidation, pleural effusion, or pneumothorax. No acute osseous abnormalities are seen. IMPRESSION: No acute pulmonary  process. Electronically Signed   By: Jeb Levering M.D.   On: 07/10/2016 02:37   Ct Head Wo Contrast  Result Date: 07/10/2016 CLINICAL DATA:  Headache. Possible seizure activity. The known fall or trauma. Initial encounter. EXAM: CT HEAD WITHOUT CONTRAST TECHNIQUE: Contiguous axial images were obtained from the base of the skull through the vertex without intravenous contrast. COMPARISON:  None. FINDINGS: There is no evidence of acute cortical infarct, intracranial hemorrhage, mass, midline shift, or extra-axial fluid collection. The ventricles and sulci are normal. Orbits are unremarkable. The frontal and maxillary sinuses are hypoplastic. There is no evidence of acute inflammatory change in the sinuses or mastoid air cells. No acute osseous abnormality is identified. IMPRESSION: Unremarkable CT appearance of the brain. Electronically Signed   By: Logan Bores M.D.   On: 07/10/2016 11:05   US Scrotum  Result Date: 07/24/2016 CLINICAL DATA:  Right testicular pain. History of testicular cancer in 2009 EXAM: [place] TESTICLES TECHNIQUE: Complete ultrasound examination of the testicles, epididymis, and other scrotal structures was performed. Color and spectral Doppler ultrasound were also utilized to evaluate blood flow to the testicles. COMPARISON:  07/16/2010 FINDINGS: Right testicle Measurements: 48 x 21 x 28 mm. No mass or microlithiasis visualized. Expected vascularity. Left testicle Surgically absent Right epididymis:  Normal in size and vascularity. Left epididymis:  Surgically absent Hydrocele:  None visualized. Varicocele:  None visualized. Pulsed Doppler interrogation of the right testicle demonstrates normal low resistance arterial and venous waveforms bilaterally. IMPRESSION: 1. No explanation for pain. 2. Left orchiectomy. Electronically Signed   By: Monte Fantasia M.D.   On: 07/24/2016 19:54   [place] Art/ven Flow Abd Pelv Doppler  Result Date:  07/24/2016 CLINICAL DATA:  Right testicular pain. History of testicular cancer in 2009 EXAM: [place] TESTICLES TECHNIQUE: Complete ultrasound examination of the testicles, epididymis, and other scrotal structures was performed. Color and spectral Doppler ultrasound were also utilized to evaluate blood flow to the testicles. COMPARISON:  07/16/2010 FINDINGS: Right testicle Measurements: 48 x 21 x 28 mm. No mass or microlithiasis visualized. Expected vascularity. Left testicle Surgically absent Right epididymis:  Normal in size and vascularity. Left epididymis:  Surgically absent Hydrocele:  None visualized. Varicocele:  None visualized. Pulsed Doppler interrogation of the right testicle demonstrates normal low resistance arterial and venous waveforms bilaterally. IMPRESSION: 1. No explanation for pain. 2. Left orchiectomy. Electronically Signed   By: Monte Fantasia  M.D.   On: 07/24/2016 19:54   Vance Gather, MD  [facility] Pager [phone]  If 7PM-7AM, please contact night-coverage [email or web address] Password [email or web address] 07/29/2016, 4:45 PM   LOS: 5 days

## 2016-07-29 NOTE — Progress Notes (Signed)
Contacted on call to see if po ativan could be increased. Waiting to hear.

## 2016-07-30 ENCOUNTER — Inpatient Hospital Stay (HOSPITAL_COMMUNITY): Payer: Self-pay

## 2016-07-30 LAB — BASIC METABOLIC PANEL
ANION GAP: 8 (ref 5–15)
BUN: 7 mg/dL (ref 6–20)
CALCIUM: 9.2 mg/dL (ref 8.9–10.3)
CHLORIDE: 105 mmol/L (ref 101–111)
CO2: 26 mmol/L (ref 22–32)
CREATININE: 1.13 mg/dL (ref 0.61–1.24)
GFR calc Af Amer: >60 mL/min (ref >60)
GFR calc non Af Amer: >60 mL/min (ref >60)
GLUCOSE: 100 mg/dL — AB (ref 65–99)
Potassium: 3.8 mmol/L (ref 3.5–5.1)
Sodium: 139 mmol/L (ref 135–145)

## 2016-07-30 MED ORDER — LORAZEPAM 2 MG/ML IJ SOLN
2.0000 mg | Freq: Once | INTRAMUSCULAR | Status: AC
  Administered 2016-07-30: 2 mg via INTRAVENOUS
  Filled 2016-07-30: qty 1

## 2016-07-30 MED ORDER — CHLORDIAZEPOXIDE HCL 5 MG PO CAPS
5.0000 mg | ORAL_CAPSULE | Freq: Once | ORAL | Status: AC
  Administered 2016-07-31: 5 mg via ORAL
  Filled 2016-07-30: qty 1

## 2016-07-30 MED ORDER — CHLORDIAZEPOXIDE HCL 5 MG PO CAPS
10.0000 mg | ORAL_CAPSULE | Freq: Once | ORAL | Status: AC
  Administered 2016-07-30: 10 mg via ORAL
  Filled 2016-07-30: qty 2

## 2016-07-30 NOTE — Progress Notes (Signed)
PROGRESS NOTE  Thomas Blake [id number] DOB: 01/18/1981 DOA: 07/24/2016 PCP: No PCP Per Patient  Brief History:  35 year old male with a history of alcohol drop seizure, alcohol abuse, cocaine abuse, tobacco abuse, testicular cancer s/p orchiectomy presented with hallucinations and wanting help with detox. The patient left AGAINST MEDICAL ADVICE after a hospital stay from 07/10/2016 through 07/12/2016. Since admission to SDU, the patient was placed on Librium and alcohol withdrawal protocol requiring increasing amounts of lorazepam IV. He reports up to 3 gallons of vodka intake per day, last drink on the morning of 07/24/2016, also intermittently used cocaine PTA. He was transferred to the floor and librium decreased to 50mg  BID on 8/22, 25mg  BID 8/24. Psychiatry was consulted for hallucinations, thought to be purely related to substance withdrawal and no medications recommended. Tapering of librium has continued, though consumption of ativan has not decreased appreciably, primarily for subjective complaints.  Assessment/Plan: Alcohol withdrawal: No evidence of DT's, VSS. CIWA scores remain elevated mostly due to subjective complaints which do not correlate with objective findings (e.g. frequent somnolence, tremor only present upon suggestion). Will continue to monitor CIWA.  - Continue wean of librium: final anticipated day 8/27. Continue ativan po per CIWA.  - Continue thiamine, MVM - D/C haldol prn as this may lower seizure threshold.  - Social work consult for substance abuse resources, possible treatment facility disposition.  - Visual hallucinations may be related to PTSD vs. grief reaction to brother's death in war, and therefore may require outpatient psychiatric care.   Fall in hospital: Episode this morning does not indicate seizure, with whole body shaking while interacting with nursing staff and no postictal period. He is neuro-intact currently. - Cervical XR - Fall  precautions continue   Polysubstance abuse: Including alcohol, tobacco, cocaine - NicoDerm patch - Cessation discussed - CSW assistance appreciated  Testicular pain: U/S negative. 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  - Neg UA and pending GC/chl. - Wean morphine 8/23, transition to po 8/24  Hypokalemia: Resolved s/p repletion with normal Mg. - Monitor as indicated  Restless leg syndrome: Chronic condition, has been off requip for 1 week, usually takes 3mg  TID.  - Reduced does restarted (1mg  TID) with hypotensive response to first dose, so returned to 0.25mg  TID. Will continue to monitor symptoms and BP. Doing well with this.   Insomnia: Chronic condition, also worsened by withdrawal symptoms.  - Restart home trazodone  Disposition Plan: Continue telemetry, anticipate D/C with plan to enter [facility] shelter 8/28  Family Communication: None at bedside.   Consultants: psychiatry, Dr. Louretta Shorten  Code Status:  FULL   DVT Prophylaxis:  [place] Lovenox  Subjective: Pt fell just prior to assessment - unwitnessed, was found on ground "shaking" which stopped when his name was called. He was alert and oriented, speaking normally once awakened, professing not to remember anything about how he came to be on the floor. Some left neck pain. Very angry about the switch to po ativan overnight.   Objective: Vitals:   07/29/16 2320 07/30/16 0536 07/30/16 0924 07/30/16 1351  BP: 106/67 109/62 101/65 114/60  Pulse: 89 86 76 79  Resp: 19 18  17   Temp: 98.5 F (36.9 C) 98.2 F (36.8 C)  98.3 F (36.8 C)  TempSrc:  Oral    SpO2: 93% 95% 100% 100%  Weight:      Height:        Intake/Output Summary (Last 24 hours) at  07/30/16 1436 Last data filed at 07/30/16 1042  Gross per 24 hour  Intake                0 ml  Output              800 ml  Net             -800 ml   Weight change:   Exam:   General:  Pt is found sleeping soundly. Not tremulous. No diaphoresis.  HEENT: No icterus, No thrush, No neck mass, [place]/AT  Neck: Full AROM, tenderness to left paraspinal musculature without spasm. No midline tenderness.   Cardiovascular: RRR, S1/S2, no rubs, no gallops  Respiratory: CTA bilaterally, no wheezing, no crackles, no rhonchi  Abdomen: Soft/+BS, non tender, non distended, no guarding  Extremities: No edema, No lymphangitis, No petechiae, No rashes, no synovitis. Calluses on plantar 4th and 5th toes bilaterally with intact sensation, brisk cap refill.   Neuro: Alert, NOT postictal. Pupils small, symmetrical, reactive. CN II-XII intact. Speech normal. No dyskinesia or asterixis. Nonfocal.  Data Reviewed: I have personally reviewed following labs and imaging studies Basic Metabolic Panel:  Recent Labs Lab 07/24/16 0450 07/25/16 0354 07/26/16 0326 07/28/16 0546 07/30/16 0600  NA 140 139 138 137 139  K 3.3* 3.3* 4.0 3.9 3.8  CL 109 112* 110 105 105  CO2 22 21* 22 25 26   GLUCOSE 95 101* 93 92 100*  BUN 7 7 6 9 7   CREATININE 1.21 1.11 1.07 1.08 1.13  CALCIUM 8.8* 8.3* 8.8* 9.0 9.2  MG 2.0  --  2.0 2.0  --   PHOS 3.5  --   --   --   --    Liver Function Tests:  Recent Labs Lab 07/24/16 0450 07/25/16 0354  AST 19 20  ALT 23 21  ALKPHOS 76 66  BILITOT 0.6 0.8  PROT 5.9* 5.1*  ALBUMIN 3.6 2.9*    Recent Labs Lab 07/24/16 0450  LIPASE 35   No results for input(s): AMMONIA in the last 168 hours. Coagulation Profile: No results for input(s): INR, PROTIME in the last 168 hours. CBC:  Recent Labs Lab 07/24/16 0450 07/25/16 0354  WBC 9.3 5.3  NEUTROABS 6.4  --   HGB 14.9 14.0  HCT 44.5 42.8  MCV 90.8 92.2  PLT 258 207   Cardiac Enzymes: No results for input(s): CKTOTAL, CKMB, CKMBINDEX, TROPONINI in the last 168 hours. BNP: Invalid input(s): POCBNP CBG: No results for input(s): GLUCAP in the last 168 hours. HbA1C: No results for  input(s): HGBA1C in the last 72 hours. Urine analysis:    Component Value Date/Time   COLORURINE YELLOW 07/27/2016 2152   APPEARANCEUR CLEAR 07/27/2016 2152   LABSPEC 1.019 07/27/2016 2152   PHURINE 7.0 07/27/2016 2152   GLUCOSEU NEGATIVE 07/27/2016 2152   HGBUR NEGATIVE 07/27/2016 2152   BILIRUBINUR NEGATIVE 07/27/2016 2152   KETONESUR NEGATIVE 07/27/2016 2152   PROTEINUR NEGATIVE 07/27/2016 2152   UROBILINOGEN 0.2 07/16/2010 2240   NITRITE NEGATIVE 07/27/2016 2152   LEUKOCYTESUR NEGATIVE 07/27/2016 2152   Sepsis Labs: @LABRCNTIP (procalcitonin:4,lacticidven:4) ) Recent Results (from the past 240 hour(s))  MRSA PCR Screening     Status: None   Collection Time: 07/24/16 10:44 AM  Result Value Ref Range Status   MRSA by PCR NEGATIVE NEGATIVE Final    Comment:        The GeneXpert MRSA Assay (FDA approved for NASAL specimens only), is one component of a comprehensive MRSA colonization surveillance  program. It is not intended to diagnose MRSA infection nor to guide or monitor treatment for MRSA infections.   Urine culture     Status: Abnormal   Collection Time: 07/27/16  9:52 PM  Result Value Ref Range Status   Specimen Description URINE, RANDOM  Final   Special Requests NONE  Final   Culture <10,000 COLONIES/mL INSIGNIFICANT GROWTH (A)  Final   Report Status 07/29/2016 FINAL  Final     Scheduled Meds: . [START ON 07/31/2016] chlordiazePOXIDE  5 mg Oral Once  . enoxaparin (LOVENOX) injection  40 mg Subcutaneous Q24H  . folic acid  1 mg Oral Daily  . LORazepam  0-4 mg Oral Q6H   Followed by  . [START ON 08/01/2016] LORazepam  0-4 mg Oral Q12H  . multivitamin with minerals  1 tablet Oral Daily  . nicotine  21 mg Transdermal Daily  . rOPINIRole  0.25 mg Oral TID  . sodium chloride  500 mL Intravenous Once  . thiamine  100 mg Oral Daily  . traZODone  100 mg Oral QHS   Continuous Infusions:   Procedures/Studies: Dg Chest 2 View  Result Date: 07/10/2016 CLINICAL  DATA:  Left-sided chest pain for 1 hour. Vomiting earlier today. EXAM: CHEST  2 VIEW COMPARISON:  None. FINDINGS: The cardiomediastinal contours are normal. The lungs are clear. Pulmonary vasculature is normal. No consolidation, pleural effusion, or pneumothorax. No acute osseous abnormalities are seen. IMPRESSION: No acute pulmonary process. Electronically Signed   By: Rubye OaksMelanie  Ehinger M.D.   On: 07/10/2016 02:37   Ct Head Wo Contrast  Result Date: 07/10/2016 CLINICAL DATA:  Headache. Possible seizure activity. The known fall or trauma. Initial encounter. EXAM: CT HEAD WITHOUT CONTRAST TECHNIQUE: Contiguous axial images were obtained from the base of the skull through the vertex without intravenous contrast. COMPARISON:  None. FINDINGS: There is no evidence of acute cortical infarct, intracranial hemorrhage, mass, midline shift, or extra-axial fluid collection. The ventricles and sulci are normal. Orbits are unremarkable. The frontal and maxillary sinuses are hypoplastic. There is no evidence of acute inflammatory change in the sinuses or mastoid air cells. No acute osseous abnormality is identified. IMPRESSION: Unremarkable CT appearance of the brain. Electronically Signed   By: Sebastian AcheAllen  Grady M.D.   On: 07/10/2016 11:05   [place] Scrotum  Result Date: 07/24/2016 CLINICAL DATA:  Right testicular pain. History of testicular cancer in 2009 EXAM: SCROTAL ULTRASOUND DOPPLER ULTRASOUND OF THE TESTICLES TECHNIQUE: Complete ultrasound examination of the testicles, epididymis, and other scrotal structures was performed. Color and spectral Doppler ultrasound were also utilized to evaluate blood flow to the testicles. COMPARISON:  07/16/2010 FINDINGS: Right testicle Measurements: 48 x 21 x 28 mm. No mass or microlithiasis visualized. Expected vascularity. Left testicle Surgically absent Right epididymis:  Normal in size and vascularity. Left epididymis:  Surgically absent Hydrocele:  None visualized. Varicocele:  None  visualized. Pulsed Doppler interrogation of the right testicle demonstrates normal low resistance arterial and venous waveforms bilaterally. IMPRESSION: 1. No explanation for pain. 2. Left orchiectomy. Electronically Signed   By: Marnee SpringJonathon  Watts M.D.   On: 07/24/2016 19:54   [place] Art/ven Flow Abd Pelv Doppler  Result Date: 07/24/2016 CLINICAL DATA:  Right testicular pain. History of testicular cancer in 2009 EXAM: SCROTAL ULTRASOUND DOPPLER ULTRASOUND OF THE TESTICLES TECHNIQUE: Complete ultrasound examination of the testicles, epididymis, and other scrotal structures was performed. Color and spectral Doppler ultrasound were also utilized to evaluate blood flow to the testicles. COMPARISON:  07/16/2010 FINDINGS: Right testicle  Measurements: 48 x 21 x 28 mm. No mass or microlithiasis visualized. Expected vascularity. Left testicle Surgically absent Right epididymis:  Normal in size and vascularity. Left epididymis:  Surgically absent Hydrocele:  None visualized. Varicocele:  None visualized. Pulsed Doppler interrogation of the right testicle demonstrates normal low resistance arterial and venous waveforms bilaterally. IMPRESSION: 1. No explanation for pain. 2. Left orchiectomy. Electronically Signed   By: Monte Fantasia M.D.   On: 07/24/2016 19:54   Vance Gather, MD  [facility] Pager [phone]  If 7PM-7AM, please contact night-coverage [email or web address] Password [email or web address] 07/30/2016, 2:36 PM   LOS: 6 days

## 2016-07-30 NOTE — Progress Notes (Signed)
07/30/16 [id number]  What Happened  Was fall witnessed? No  Was patient injured? No  Patient found on floor  Found by Staff-comment  Stated prior activity other (comment) (eating)  Follow Up  MD notified (yes GRunz)  Time MD notified [phone]  Family notified Yes-comment Harrell Gave)  Time family notified 0932  Adult Fall Risk Assessment  Risk Factor Category (scoring not indicated) Fall has occurred during this admission (document High fall risk)  Age 35  Fall History: Fall within 6 months prior to admission 5  Elimination; Bowel and/or Urine Incontinence 0  Elimination; Bowel and/or Urine Urgency/Frequency 0  Medications: includes PCA/Opiates, Anti-convulsants, Anti-hypertensives, Diuretics, Hypnotics, Laxatives, Sedatives, and Psychotropics 5  Patient Care Equipment 0  Mobility-Assistance 0  Mobility-Gait 0  Mobility-Sensory Deficit 0  Altered awareness of immediate physical environment 0  Impulsiveness 0  Lack of understanding of one's physical/cognitive limitations 4  Total Score 14  Patient's Fall Risk High Fall Risk (>13 points)  Vitals  BP 101/65  MAP (mmHg) 74  BP Method Automatic  Pulse Rate 76  Pulse Rate Source Monitor  Oxygen Therapy  SpO2 100 %  Pain Assessment  Pain Assessment 0-10  Pain Score 4  Pain Type Acute pain  Pain Location Neck  Pain Orientation Left  Pain Descriptors / Indicators Tender  Pain Frequency Constant  Pain Onset On-going  Patients Stated Pain Goal 1  Pain Intervention(s) MD notified (Comment)  Neurological  Neuro (WDL) WDL  Level of Consciousness Alert  Orientation Level Oriented to person;Oriented to place;Oriented to situation  Cognition Appropriate attention/concentration;Follows commands  Speech Clear  Pupil Assessment  Yes  R Pupil Size (mm) 2  R Pupil Shape Round  R Pupil Reaction Brisk  L Pupil Size (mm) 2  L Pupil Shape Round  L Pupil Reaction Brisk  Additional Pupil Assessments No  Motor Function/Sensation Assessment  Grip;Dorsiflexion;Plantar flexion;Motor response  R Hand Grip Present;Moderate  L Hand Grip Present;Moderate   R Foot Dorsiflexion Present;Moderate  L Foot Dorsiflexion Present;Moderate  R Foot Plantar Flexion Present;Moderate  L Foot Plantar Flexion Present;Moderate  RUE Motor Response Purposeful movement  RUE Motor Strength 5  LUE Motor Response Purposeful movement  LUE Motor Strength 5  RLE Motor Response Purposeful movement  RLE Motor Strength 5  LLE Motor Response Purposeful movement  LLE Motor Strength 5  Neuro Symptoms Tremors  Neuro symptoms relieved by Rest  Neuro Additional Assessments No

## 2016-07-30 NOTE — Progress Notes (Signed)
?   Seizure vs Pseudoseizure <1 min per RN Shaking No postictal state.   Exam:  Cn2-12 intact Reflexes 2+ symmetric, diffuse, w downgoing toes bilaterally Motor 5/5 in all 4 ext.    A/P ?seizure vs pseudoseizure (more likely pseudoseizure per RN,  Shaking possibly from etoh withdrawal MRI brain w/o contrast EEG

## 2016-07-31 ENCOUNTER — Inpatient Hospital Stay (HOSPITAL_COMMUNITY): Payer: Self-pay

## 2016-07-31 DIAGNOSIS — R569 Unspecified convulsions: Secondary | ICD-10-CM

## 2016-07-31 LAB — COMPREHENSIVE METABOLIC PANEL
ALK PHOS: 87 U/L (ref 38–126)
ALT: 133 U/L — ABNORMAL HIGH (ref 17–63)
ANION GAP: 6 (ref 5–15)
AST: 64 U/L — ABNORMAL HIGH (ref 15–41)
Albumin: 3.4 g/dL — ABNORMAL LOW (ref 3.5–5.0)
BUN: 6 mg/dL (ref 6–20)
CALCIUM: 9.3 mg/dL (ref 8.9–10.3)
CO2: 26 mmol/L (ref 22–32)
Chloride: 105 mmol/L (ref 101–111)
Creatinine, Ser: 1.23 mg/dL (ref 0.61–1.24)
Glucose, Bld: 106 mg/dL — ABNORMAL HIGH (ref 65–99)
Potassium: 4.4 mmol/L (ref 3.5–5.1)
SODIUM: 137 mmol/L (ref 135–145)
TOTAL PROTEIN: 5.7 g/dL — AB (ref 6.5–8.1)
Total Bilirubin: 0.6 mg/dL (ref 0.3–1.2)

## 2016-07-31 LAB — CBC
HCT: 44.9 % (ref 39.0–52.0)
HEMOGLOBIN: 14.7 g/dL (ref 13.0–17.0)
MCH: 30.2 pg (ref 26.0–34.0)
MCHC: 32.7 g/dL (ref 30.0–36.0)
MCV: 92.4 fL (ref 78.0–100.0)
Platelets: 223 10*3/uL (ref 150–400)
RBC: 4.86 MIL/uL (ref 4.22–5.81)
RDW: 13.3 % (ref 11.5–15.5)
WBC: 7.9 10*3/uL (ref 4.0–10.5)

## 2016-07-31 LAB — CREATININE, SERUM
CREATININE: 1.18 mg/dL (ref 0.61–1.24)
GFR calc Af Amer: >60 mL/min (ref >60)
GFR calc non Af Amer: >60 mL/min (ref >60)

## 2016-07-31 NOTE — Progress Notes (Signed)
Pt spit trazodone in emesis bag by accident. Another trazodone given, witnessed by RR nurse.

## 2016-07-31 NOTE — Progress Notes (Signed)
Around 1835 Pt c/o shaking to RN and at [phone] patient was actively seizing for less than 1 minute. Charge nurse at bed side, during seizure patient's hand elevated above face and did not drop on face, instead dropped on chest, eye movement present during seizure activity. After seizure patient disoriented to place, neuro assessment with in normal limits, normal motor movements with some weakness, pupils reactive and 3 cm in diameter, VS stable. MD notified and order for Ativan 2mg  once given.  At 2150 RN called in room pt c/o nausea and vomiting, at 0955 RR nurse at bedside, VS stable, c/o dizziness and tremor. After explained that Ativan cannot be given if patient dizzy and weak, he stated he feels better. Trazodone scheduled for 2200 was given at 2152 and Zofran 4 mg PRN. At 2205 pt asleep.  Based on CIWA results 1mg  PO Ativan given at [place]. Pt resting. BP on the lower end, MD notified.

## 2016-07-31 NOTE — Progress Notes (Signed)
PROGRESS NOTE  Thomas Blake [id number] DOB: November 04, 1981 DOA: 07/24/2016 PCP: No PCP Per Patient  Brief History:  35 year old male with a history of alcohol drop seizure, alcohol abuse, cocaine abuse, tobacco abuse, testicular cancer s/p orchiectomy presented with hallucinations and wanting help with detox. The patient left AGAINST MEDICAL ADVICE after a hospital stay from 07/10/2016 through 07/12/2016. Since admission to SDU, the patient was placed on Librium and alcohol withdrawal protocol requiring increasing amounts of lorazepam IV. He reports up to 3 gallons of vodka intake per day, last drink on the morning of 07/24/2016, also intermittently used cocaine PTA. He was transferred to the floor and librium decreased to 50mg  BID on 8/22, 25mg  BID 8/24. Psychiatry was consulted for hallucinations, thought to be purely related to substance withdrawal and no medications recommended. Tapering of librium has continued, though consumption of ativan has not decreased appreciably, primarily for subjective complaints. Has had a couple episodes of shaking, concern for seizure vs. pseudoseizure. Prvious EEG neg, EEG has been ordered and neurology consulted.   Assessment/Plan: Alcohol withdrawal: No evidence of DT's, VSS. CIWA scores remain elevated mostly due to subjective complaints which do not correlate with objective findings (e.g. frequent somnolence, tremor only present upon suggestion). Will continue to monitor CIWA.  - Continue wean of librium: final anticipated day 8/27. Continue ativan po per CIWA.  - Continue thiamine, MVM - D/C haldol prn as this may lower seizure threshold.  - Social work consult for substance abuse resources, possible treatment facility disposition.  - Visual hallucinations may be related to PTSD vs. grief reaction to brother's death in war, and therefore may require outpatient psychiatric care.    Shaking episodes: Seizure vs. pseudoseizure. MRI negative. Noted  episodes of shaking not consistent with classic seizure-like activity. Will get bloodwork (previously stable), EEG and consult neurology.   Fall in hospital: Episode 8/26 does not indicate seizure, with whole body shaking while interacting with nursing staff and no postictal period. He is neuro-intact currently. - Cervical XR neg - Fall precautions continue   Polysubstance abuse: Including alcohol, tobacco, cocaine - NicoDerm patch - Cessation discussed - CSW assistance appreciated  Testicular pain: U/S negative. 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  - Neg UA and pending GC/chl. - Wean morphine 8/23, transitioned to po 8/24  Hypokalemia: Resolved s/p repletion with normal Mg. - Monitor as indicated  Restless leg syndrome: Chronic condition, has been off requip for 1 week, usually takes 3mg  TID.  - Reduced does restarted (1mg  TID) with hypotensive response to first dose, so returned to 0.25mg  TID. Will continue to monitor symptoms and BP. Doing well with this.   Insomnia: Chronic condition, also worsened by withdrawal symptoms.  - Restart home trazodone  Disposition Plan: Continue telemetry, D/C pending improvement in withdrawal, neurological evaluation.   Family Communication: None at bedside.   Consultants: psychiatry, Dr. Louretta Shorten  Code Status:  FULL   DVT Prophylaxis:  [place] Lovenox  Subjective: Pt reporting double vision intermittently and has had episodes of shaking documented by nursing staff. He reports no memory of these events. He is alert this morning.   Objective: Vitals:   07/31/16 0045 07/31/16 0400 07/31/16 0635 07/31/16 1227  BP: 100/67 (!) 93/56 (!) 96/59 (!) 94/45  Pulse: 79 79 62 71  Resp: 18 18 16 15   Temp: 98.2 F (36.8 C) 98 F (36.7 C) 98.2 F (36.8 C) 97.5 F (36.4 C)  TempSrc: Oral Oral Oral Oral  SpO2: 99% 99%  96%  Weight:      Height:         Intake/Output Summary (Last 24 hours) at 07/31/16 1354 Last data filed at 07/30/16 2210  Gross per 24 hour  Intake                0 ml  Output              850 ml  Net             -850 ml   Weight change:  Exam:   General:  Pt is found sleeping soundly. Not tremulous. No diaphoresis.  HEENT: No icterus, No thrush, No neck mass, [place]/AT  Neck: Full AROM, tenderness to left paraspinal musculature without spasm. No midline tenderness.   Cardiovascular: RRR, S1/S2, no rubs, no gallops  Respiratory: CTA bilaterally, no wheezing, no crackles, no rhonchi  Abdomen: Soft/+BS, non tender, non distended, no guarding  Extremities: No edema, No lymphangitis, No petechiae, No rashes, no synovitis. Calluses on plantar 4th and 5th toes bilaterally with intact sensation, brisk cap refill.   Neuro: Alert, NOT postictal. Pupils small, symmetrical, reactive. CN II-XII intact. Speech normal. No dyskinesia or asterixis. Nonfocal.  Data Reviewed: I have personally reviewed following labs and imaging studies Basic Metabolic Panel:  Recent Labs Lab 07/25/16 0354 07/26/16 0326 07/28/16 0546 07/30/16 0600 07/31/16 0621  NA 139 138 137 139  --   K 3.3* 4.0 3.9 3.8  --   CL 112* 110 105 105  --   CO2 21* 22 25 26   --   GLUCOSE 101* 93 92 100*  --   BUN 7 6 9 7   --   CREATININE 1.11 1.07 1.08 1.13 1.18  CALCIUM 8.3* 8.8* 9.0 9.2  --   MG  --  2.0 2.0  --   --    Liver Function Tests:  Recent Labs Lab 07/25/16 0354  AST 20  ALT 21  ALKPHOS 66  BILITOT 0.8  PROT 5.1*  ALBUMIN 2.9*   No results for input(s): LIPASE, AMYLASE in the last 168 hours. No results for input(s): AMMONIA in the last 168 hours. Coagulation Profile: No results for input(s): INR, PROTIME in the last 168 hours. CBC:  Recent Labs Lab 07/25/16 0354  WBC 5.3  HGB 14.0  HCT 42.8  MCV 92.2  PLT 207   Cardiac Enzymes: No results for input(s): CKTOTAL, CKMB, CKMBINDEX, TROPONINI in the last 168  hours. BNP: Invalid input(s): POCBNP CBG: No results for input(s): GLUCAP in the last 168 hours. HbA1C: No results for input(s): HGBA1C in the last 72 hours. Urine analysis:    Component Value Date/Time   COLORURINE YELLOW 07/27/2016 2152   APPEARANCEUR CLEAR 07/27/2016 2152   LABSPEC 1.019 07/27/2016 2152   PHURINE 7.0 07/27/2016 2152   GLUCOSEU NEGATIVE 07/27/2016 2152   HGBUR NEGATIVE 07/27/2016 2152   BILIRUBINUR NEGATIVE 07/27/2016 2152   KETONESUR NEGATIVE 07/27/2016 2152   PROTEINUR NEGATIVE 07/27/2016 2152   UROBILINOGEN 0.2 07/16/2010 2240   NITRITE NEGATIVE 07/27/2016 2152   LEUKOCYTESUR NEGATIVE 07/27/2016 2152   Sepsis Labs: @LABRCNTIP (procalcitonin:4,lacticidven:4) ) Recent Results (from the past 240 hour(s))  MRSA PCR Screening     Status: None   Collection Time: 07/24/16 10:44 AM  Result Value Ref Range Status   MRSA by PCR NEGATIVE NEGATIVE Final    Comment:        The GeneXpert MRSA Assay (FDA approved for NASAL specimens only), is one component  of a comprehensive MRSA colonization surveillance program. It is not intended to diagnose MRSA infection nor to guide or monitor treatment for MRSA infections.   Urine culture     Status: Abnormal   Collection Time: 07/27/16  9:52 PM  Result Value Ref Range Status   Specimen Description URINE, RANDOM  Final   Special Requests NONE  Final   Culture <10,000 COLONIES/mL INSIGNIFICANT GROWTH (A)  Final   Report Status 07/29/2016 FINAL  Final     Scheduled Meds: . enoxaparin (LOVENOX) injection  40 mg Subcutaneous Q24H  . folic acid  1 mg Oral Daily  . LORazepam  0-4 mg Oral Q6H   Followed by  . [START ON 08/01/2016] LORazepam  0-4 mg Oral Q12H  . multivitamin with minerals  1 tablet Oral Daily  . nicotine  21 mg Transdermal Daily  . rOPINIRole  0.25 mg Oral TID  . sodium chloride  500 mL Intravenous Once  . thiamine  100 mg Oral Daily  . traZODone  100 mg Oral QHS   Continuous Infusions:    Procedures/Studies: Dg Chest 2 View  Result Date: 07/10/2016 CLINICAL DATA:  Left-sided chest pain for 1 hour. Vomiting earlier today. EXAM: CHEST  2 VIEW COMPARISON:  None. FINDINGS: The cardiomediastinal contours are normal. The lungs are clear. Pulmonary vasculature is normal. No consolidation, pleural effusion, or pneumothorax. No acute osseous abnormalities are seen. IMPRESSION: No acute pulmonary process. Electronically Signed   By: Jeb Levering M.D.   On: 07/10/2016 02:37   Dg Cervical Spine 2 Or 3 Views  Result Date: 07/30/2016 CLINICAL DATA:  Neck pain. EXAM: CERVICAL SPINE - 2-3 VIEW COMPARISON:  None FINDINGS: There is no evidence of cervical spine fracture or prevertebral soft tissue swelling. Alignment is normal. No other significant bone abnormalities are identified. IMPRESSION: Negative cervical spine radiographs. Electronically Signed   By: Kerby Moors M.D.   On: 07/30/2016 14:47   Ct Head Wo Contrast  Result Date: 07/10/2016 CLINICAL DATA:  Headache. Possible seizure activity. The known fall or trauma. Initial encounter. EXAM: CT HEAD WITHOUT CONTRAST TECHNIQUE: Contiguous axial images were obtained from the base of the skull through the vertex without intravenous contrast. COMPARISON:  None. FINDINGS: There is no evidence of acute cortical infarct, intracranial hemorrhage, mass, midline shift, or extra-axial fluid collection. The ventricles and sulci are normal. Orbits are unremarkable. The frontal and maxillary sinuses are hypoplastic. There is no evidence of acute inflammatory change in the sinuses or mastoid air cells. No acute osseous abnormality is identified. IMPRESSION: Unremarkable CT appearance of the brain. Electronically Signed   By: Logan Bores M.D.   On: 07/10/2016 11:05   Mr Brain Wo Contrast  Result Date: 07/31/2016 CLINICAL DATA:  Hallucinations, requesting detox. History of alcohol abuse, cocaine abuse, testicular cancer. EXAM: MRI HEAD WITHOUT CONTRAST  TECHNIQUE: Multiplanar, multiecho pulse sequences of the brain and surrounding structures were obtained without intravenous contrast. COMPARISON:  CT HEAD July 10, 2016 FINDINGS: INTRACRANIAL CONTENTS: No reduced diffusion to suggest acute ischemia or postictal changes. No susceptibility artifact to suggest hemorrhage. The ventricles and sulci are normal for patient's age. No suspicious parenchymal signal, masses or mass effect. No abnormal extra-axial fluid collections. No extra-axial masses though, contrast enhanced sequences would be more sensitive. Normal major intracranial vascular flow voids present at skull base. Normal symmetric size, morphology and signal of the hippocampi. ORBITS: The included ocular globes and orbital contents are non-suspicious. SINUSES: Chronic maxillary sinusitis.  Hypoplastic frontal sinuses. SKULL/SOFT TISSUES:  No abnormal sellar expansion. No suspicious calvarial bone marrow signal. Craniocervical junction maintained. Prominent lymph nodes in the neck are likely reactive. IMPRESSION: Normal noncontrast MRI head. Electronically Signed   By: Elon Alas M.D.   On: 07/31/2016 05:38   US Scrotum  Result Date: 07/24/2016 CLINICAL DATA:  Right testicular pain. History of testicular cancer in 2009 EXAM: [place] TESTICLES TECHNIQUE: Complete ultrasound examination of the testicles, epididymis, and other scrotal structures was performed. Color and spectral Doppler ultrasound were also utilized to evaluate blood flow to the testicles. COMPARISON:  07/16/2010 FINDINGS: Right testicle Measurements: 48 x 21 x 28 mm. No mass or microlithiasis visualized. Expected vascularity. Left testicle Surgically absent Right epididymis:  Normal in size and vascularity. Left epididymis:  Surgically absent Hydrocele:  None visualized. Varicocele:  None visualized. Pulsed Doppler interrogation of the right testicle demonstrates normal low resistance arterial and  venous waveforms bilaterally. IMPRESSION: 1. No explanation for pain. 2. Left orchiectomy. Electronically Signed   By: Monte Fantasia M.D.   On: 07/24/2016 19:54   [place] Art/ven Flow Abd Pelv Doppler  Result Date: 07/24/2016 CLINICAL DATA:  Right testicular pain. History of testicular cancer in 2009 EXAM: [place] TESTICLES TECHNIQUE: Complete ultrasound examination of the testicles, epididymis, and other scrotal structures was performed. Color and spectral Doppler ultrasound were also utilized to evaluate blood flow to the testicles. COMPARISON:  07/16/2010 FINDINGS: Right testicle Measurements: 48 x 21 x 28 mm. No mass or microlithiasis visualized. Expected vascularity. Left testicle Surgically absent Right epididymis:  Normal in size and vascularity. Left epididymis:  Surgically absent Hydrocele:  None visualized. Varicocele:  None visualized. Pulsed Doppler interrogation of the right testicle demonstrates normal low resistance arterial and venous waveforms bilaterally. IMPRESSION: 1. No explanation for pain. 2. Left orchiectomy. Electronically Signed   By: Monte Fantasia M.D.   On: 07/24/2016 19:54   Vance Gather, MD  [facility] Pager [phone]  If 7PM-7AM, please contact night-coverage [email or web address] Password [email or web address] 07/31/2016, 1:54 PM   LOS: 7 days

## 2016-07-31 NOTE — Consult Note (Signed)
Initial Neurological Consultation                      NEURO HOSPITALIST CONSULT NOTE   Requestig physician: Dr. Bonner Puna   Reason for Consult:  Seizures   HPI:                                                                                                                                          Thomas Blake is an 35 y.o. male who is admitted to the hospital with a history of alcohol and cocaine abuse for assistance with symptoms of hallucinations. The notes indicate that there is a history of seizures in the setting of alcohol withdrawal. It is also noted that he had an episode of shaking lasting less than a minute yesterday suspicious for possible seizure activity. Patient has no recollection of the event. He reports no tongue biting or incontinence associated with the event he reports no distinct aura or prodrome with the event and otherwise has no recollection of it. He reports a history of seizures as a child. He is unable to provide a detailed history of any prior seizure activity.  Past Medical History:  Diagnosis Date  . AKI (acute kidney injury) ([place])   . Cancer [facility]) 2009   testicular  . ETOH abuse   . Seizure ([place])   . Tobacco abuse     Past Surgical History:  Procedure Laterality Date  . ORCHIECTOMY  2009    MEDICATIONS:                                                                                                                     I have reviewed the patient's current medications.  Allergies  Allergen Reactions  . Tramadol Other (See Comments)    Possibly caused seizure/ shaking     Social History:  reports that he has been smoking Cigarettes.  He has never used smokeless tobacco. He reports that he drinks alcohol. He reports that he uses drugs, including Cocaine.  Family History  Problem Relation Age of Onset  . Diabetes Mother   . Diabetes Father   . CAD Father   . Microcephaly Father      ROS:  History obtained from chart review  General ROS: negative for - chills, fatigue, fever, night sweats, weight gain or weight loss Psychological ROS: negative for - behavioral disorder, hallucinations, memory difficulties, mood swings or suicidal ideation Ophthalmic ROS: negative for - blurry vision, double vision, eye pain or loss of vision ENT ROS: negative for - epistaxis, nasal discharge, oral lesions, sore throat, tinnitus or vertigo Allergy and Immunology ROS: negative for - hives or itchy/watery eyes Hematological and Lymphatic ROS: negative for - bleeding problems, bruising or swollen lymph nodes Endocrine ROS: negative for - galactorrhea, hair pattern changes, polydipsia/polyuria or temperature intolerance Respiratory ROS: negative for - cough, hemoptysis, shortness of breath or wheezing Cardiovascular ROS: negative for - chest pain, dyspnea on exertion, edema or irregular heartbeat Gastrointestinal ROS: negative for - abdominal pain, diarrhea, hematemesis, nausea/vomiting or stool incontinence Genito-Urinary ROS: negative for - dysuria, hematuria, incontinence or urinary frequency/urgency Musculoskeletal ROS: negative for - joint swelling or muscular weakness Neurological ROS: as noted in HPI Dermatological ROS: negative for rash and skin lesion changes   General Exam                                                                                                      Blood pressure (!) 94/45, pulse 71, temperature 97.5 F (36.4 C), temperature source Oral, resp. rate 15, height 5\' 10"  (1.778 m), weight 82.7 kg (182 lb 5.1 oz), SpO2 96 %. HEENT-  Normocephalic, no lesions, without obvious abnormality.  Normal external eye and conjunctiva.  Normal TM's bilaterally.  Normal auditory canals and external ears. Normal external nose, mucus membranes and septum.  Normal pharynx. Cardiovascular- regular rate and  rhythm, S1, S2 normal, no murmur, click, rub or gallop, pulses palpable throughout   Lungs- chest clear, no wheezing, rales, normal symmetric air entry, Heart exam - S1, S2 normal, no murmur, no gallop, rate regular Abdomen- soft, non-tender; bowel sounds normal; no masses,  no organomegaly Extremities- less then 2 second capillary refill   Neurological Examination Mental Status: Alert, oriented, thought content appropriate.  Speech fluent without evidence of aphasia.  Able to follow 3 step commands without difficulty. Cranial Nerves: II: Discs flat bilaterally; Visual fields grossly normal, pupils equal, round, reactive to light and accommodation III,IV, VI: ptosis not present, extra-ocular motions intact bilaterally V,VII: smile symmetric, facial light touch sensation normal bilaterally VIII: hearing normal bilaterally IX,X: uvula rises symmetrically XI: bilateral shoulder shrug XII: midline tongue extension Motor: Right : Upper extremity   5/5    Left:     Upper extremity   5/5  Lower extremity   5/5     Lower extremity   5/5 Tone and bulk:normal tone throughout; no atrophy noted Sensory: Pinprick and light touch intact throughout, bilaterally Deep Tendon Reflexes: 2+ and symmetric throughout Plantars: Right: downgoing   Left: downgoing Cerebellar: Mild tremor on finger-nose testing. Gait: normal gait and station      Lab Results: Basic Metabolic Panel:  Recent Labs Lab 07/25/16 0354 07/26/16 0326 07/28/16 0546 07/30/16 0600 07/31/16 0621  NA 139 138 137 139  --  K 3.3* 4.0 3.9 3.8  --   CL 112* 110 105 105  --   CO2 21* 22 25 26   --   GLUCOSE 101* 93 92 100*  --   BUN 7 6 9 7   --   CREATININE 1.11 1.07 1.08 1.13 1.18  CALCIUM 8.3* 8.8* 9.0 9.2  --   MG  --  2.0 2.0  --   --     Liver Function Tests:  Recent Labs Lab 07/25/16 0354  AST 20  ALT 21  ALKPHOS 66  BILITOT 0.8  PROT 5.1*  ALBUMIN 2.9*   No results for input(s): LIPASE, AMYLASE in the  last 168 hours. No results for input(s): AMMONIA in the last 168 hours.  CBC:  Recent Labs Lab 07/25/16 0354 07/31/16 1407  WBC 5.3 7.9  HGB 14.0 14.7  HCT 42.8 44.9  MCV 92.2 92.4  PLT 207 223    Cardiac Enzymes: No results for input(s): CKTOTAL, CKMB, CKMBINDEX, TROPONINI in the last 168 hours.  Lipid Panel: No results for input(s): CHOL, TRIG, HDL, CHOLHDL, VLDL, LDLCALC in the last 168 hours.  CBG: No results for input(s): GLUCAP in the last 168 hours.  Microbiology: Results for orders placed or performed during the hospital encounter of 07/24/16  MRSA PCR Screening     Status: None   Collection Time: 07/24/16 10:44 AM  Result Value Ref Range Status   MRSA by PCR NEGATIVE NEGATIVE Final    Comment:        The GeneXpert MRSA Assay (FDA approved for NASAL specimens only), is one component of a comprehensive MRSA colonization surveillance program. It is not intended to diagnose MRSA infection nor to guide or monitor treatment for MRSA infections.   Urine culture     Status: Abnormal   Collection Time: 07/27/16  9:52 PM  Result Value Ref Range Status   Specimen Description URINE, RANDOM  Final   Special Requests NONE  Final   Culture <10,000 COLONIES/mL INSIGNIFICANT GROWTH (A)  Final   Report Status 07/29/2016 FINAL  Final    Coagulation Studies: No results for input(s): LABPROT, INR in the last 72 hours.  Imaging: Dg Cervical Spine 2 Or 3 Views  Result Date: 07/30/2016 CLINICAL DATA:  Neck pain. EXAM: CERVICAL SPINE - 2-3 VIEW COMPARISON:  None FINDINGS: There is no evidence of cervical spine fracture or prevertebral soft tissue swelling. Alignment is normal. No other significant bone abnormalities are identified. IMPRESSION: Negative cervical spine radiographs. Electronically Signed   By: Kerby Moors M.D.   On: 07/30/2016 14:47   Mr Brain Wo Contrast  Result Date: 07/31/2016 CLINICAL DATA:  Hallucinations, requesting detox. History of alcohol abuse,  cocaine abuse, testicular cancer. EXAM: MRI HEAD WITHOUT CONTRAST TECHNIQUE: Multiplanar, multiecho pulse sequences of the brain and surrounding structures were obtained without intravenous contrast. COMPARISON:  CT HEAD July 10, 2016 FINDINGS: INTRACRANIAL CONTENTS: No reduced diffusion to suggest acute ischemia or postictal changes. No susceptibility artifact to suggest hemorrhage. The ventricles and sulci are normal for patient's age. No suspicious parenchymal signal, masses or mass effect. No abnormal extra-axial fluid collections. No extra-axial masses though, contrast enhanced sequences would be more sensitive. Normal major intracranial vascular flow voids present at skull base. Normal symmetric size, morphology and signal of the hippocampi. ORBITS: The included ocular globes and orbital contents are non-suspicious. SINUSES: Chronic maxillary sinusitis.  Hypoplastic frontal sinuses. SKULL/SOFT TISSUES: No abnormal sellar expansion. No suspicious calvarial bone marrow signal. Craniocervical junction maintained. Prominent lymph  nodes in the neck are likely reactive. IMPRESSION: Normal noncontrast MRI head. Electronically Signed   By: Elon Alas M.D.   On: 07/31/2016 05:38    Assessment/Plan:  Thomas Blake is a pleasant 35 year old patient who presents with a history of a few seizures that have occurred in the past. Unfortunately, he is unable to recall any specific details pertaining to these. From the notes it appears that they're likely related to alcohol withdrawal. The notes indicate that Thomas Blake had a seizure yesterday. However, Thomas Blake has no recall of the event. An EEG has been requested for tomorrow morning. A prior EEG on 07/11/2016 was normal.  It is suspected that these events are likely related to the unfortunate history of substance abuse. Fortunately, withdrawal seizures do not respond to standard antiepileptic medications. The treatment is that of assisting the patient in coming off of  the substances of abuse. His MRI of the brain was normal.  Plan:  1. Review EEG tomorrow morning  2. Standard seizure precautions.    Thomas Blake A. Tasia Catchings, M.D. Neurohospitalist Phone: [phone]  07/31/2016, 2:45 PM

## 2016-07-31 NOTE — Progress Notes (Signed)
Dr. Maudie Mercury notified about pt BP, instruction given to RN to continue to monitor.

## 2016-08-01 ENCOUNTER — Inpatient Hospital Stay (HOSPITAL_COMMUNITY): Payer: Self-pay

## 2016-08-01 DIAGNOSIS — F10230 Alcohol dependence with withdrawal, uncomplicated: Secondary | ICD-10-CM

## 2016-08-01 DIAGNOSIS — G9340 Encephalopathy, unspecified: Secondary | ICD-10-CM

## 2016-08-01 LAB — GC/CHLAMYDIA PROBE AMP (~~LOC~~) NOT AT ARMC
Chlamydia: NEGATIVE
NEISSERIA GONORRHEA: NEGATIVE

## 2016-08-01 LAB — URINALYSIS, ROUTINE W REFLEX MICROSCOPIC
Bilirubin Urine: NEGATIVE
Glucose, UA: NEGATIVE mg/dL
KETONES UR: NEGATIVE mg/dL
LEUKOCYTES UA: NEGATIVE
NITRITE: NEGATIVE
PH: 7 (ref 5.0–8.0)
Protein, ur: NEGATIVE mg/dL
SPECIFIC GRAVITY, URINE: 1.011 (ref 1.005–1.030)

## 2016-08-01 LAB — URINE MICROSCOPIC-ADD ON

## 2016-08-01 MED ORDER — OXYCODONE HCL 5 MG PO TABS
5.0000 mg | ORAL_TABLET | Freq: Two times a day (BID) | ORAL | Status: DC | PRN
  Administered 2016-08-01 – 2016-08-02 (×2): 5 mg via ORAL
  Filled 2016-08-01 (×2): qty 1

## 2016-08-01 NOTE — Progress Notes (Signed)
MD notified about Urinalysis results.

## 2016-08-01 NOTE — Procedures (Signed)
HPI:  35 y/o with hallucinations and an episode of shaking concerning for seizure.  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  TECHNICAL SUMMARY:  A multichannel referential and bipolar montage EEG using the standard international 10-20 system was performed on the patient described as awake and drowsy.  The dominant background activity consists of 10 hertz activity seen most prominantly over the posterior head region.  The backgound activity is reactive to eye opening and closing procedures.  Low voltage fast (beta) activity is distributed symmetrically and maximally over the anterior head regions.  ACTIVATION:  Stepwise photic stimulation at 4-20 flashes per second was performed and did not elicit any abnormal waveforms but did produce a symmetric driving response.  Hyperventilation was performed for 3 minutes with good patient effort and produced no changes in the background activity.  EPILEPTIFORM ACTIVITY:  There were no spikes, sharp waves or paroxysmal activity.  SLEEP:  Physiologic drowsiness is noted, but no stage II sleep  CARDIAC:  The EKG lead revealed a regular sinus rhythm.  IMPRESSION:  This is a normal EEG for the patients stated age.  There were no focal, hemispheric or lateralizing features.  No epileptiform activity was recorded.  A normal EEG does not exclude the diagnosis of a seizure disorder and if seizure remains high on the list of differential diagnosis, an ambulatory EEG may be of value.  Clinical correlation is required.

## 2016-08-01 NOTE — Progress Notes (Addendum)
Neurology Progress Note  Subjective: Chart reviewed. The patient was seen and examined in the EEG suite as he was being prepared for his EEG. He has no complaints today. He reports a history of seizures associated with alcohol/alcohol withdrawal. He has no recollection of any of these seizures and states that everything he knows comes from witness reports. He reports that his last drink was eleven days ago. Per ED note on 07/24/16, he stated at that time that his last drink was on 07/23/16. He also endorsed cocaine use on presentation, last used 07/22/16.   With regards to seizure, this information is obtained from review of the patient's medical record since he is unable to provide any information. There is a note from his RN on 07/30/16 where he complained of shaking to his nurse. He was then noted to be "actively seizing for less than 1 minute." The note goes on to say that the charge nurse was at the bedside and during this episode they elevated his hand above his face and dropped it. They noticed that his hand did not hit his face and instead dropped onto his chest. They also described eye movements being present during his seizure ut these are not characterized. Following the seizure he was disoriented to place with "neuro assessment within normal limits, normal motor movements with some weakness." The covering physician was notified and the patient was given Ativan 2 mg.  Current Meds:   Current Facility-Administered Medications:  .  acetaminophen (TYLENOL) tablet 650 mg, 650 mg, Oral, Q6H PRN, 650 mg at 07/24/16 1057 **OR** acetaminophen (TYLENOL) suppository 650 mg, 650 mg, Rectal, Q6H PRN, Samella Parr, NP .  camphor-menthol (SARNA) lotion, , Topical, PRN, Orson Eva, MD .  enoxaparin (LOVENOX) injection 40 mg, 40 mg, Subcutaneous, Q24H, Samella Parr, NP, 40 mg at 07/31/16 1034 .  folic acid (FOLVITE) tablet 1 mg, 1 mg, Oral, Daily, Ritta Slot, NP, 1 mg at 08/01/16 0905 .  [EXPIRED]  LORazepam (ATIVAN) tablet 0-4 mg, 0-4 mg, Oral, Q6H, 2 mg at 07/31/16 1808 **FOLLOWED BY** LORazepam (ATIVAN) tablet 0-4 mg, 0-4 mg, Oral, Q12H, Ritta Slot, NP, 2 mg at 08/01/16 0059 .  LORazepam (ATIVAN) tablet 1 mg, 1 mg, Oral, Q6H PRN, 1 mg at 08/01/16 0905 **OR** [DISCONTINUED] LORazepam (ATIVAN) injection 1 mg, 1 mg, Intravenous, Q6H PRN, Ritta Slot, NP .  multivitamin with minerals tablet 1 tablet, 1 tablet, Oral, Daily, Orson Eva, MD, 1 tablet at 08/01/16 0905 .  nicotine (NICODERM CQ - dosed in mg/24 hours) patch 21 mg, 21 mg, Transdermal, Daily, Samella Parr, NP, 21 mg at 08/01/16 0905 .  ondansetron (ZOFRAN) injection 4 mg, 4 mg, Intravenous, Q4H PRN, Orson Eva, MD, 4 mg at 07/30/16 2153 .  oxyCODONE (Oxy IR/ROXICODONE) immediate release tablet 5 mg, 5 mg, Oral, Q4H PRN, Patrecia Pour, MD, 5 mg at 07/31/16 1959 .  rOPINIRole (REQUIP) tablet 0.25 mg, 0.25 mg, Oral, TID, Patrecia Pour, MD, 0.25 mg at 08/01/16 0905 .  sodium chloride 0.9 % bolus 500 mL, 500 mL, Intravenous, Once, Gardiner Barefoot, NP .  thiamine (VITAMIN B-1) tablet 100 mg, 100 mg, Oral, Daily, 100 mg at 08/01/16 0905 **OR** [DISCONTINUED] thiamine (B-1) injection 100 mg, 100 mg, Intravenous, Daily, Hannah Muthersbaugh, PA-C, 100 mg at 07/24/16 0443 .  traZODone (DESYREL) tablet 100 mg, 100 mg, Oral, QHS, Patrecia Pour, MD, 100 mg at 07/31/16 2224  Objective:  Temp:  [97.5 F (36.4 C)-98.4 F (  36.9 C)] 97.5 F (36.4 C) (08/28 0606) Pulse Rate:  [69-85] 69 (08/28 0606) Resp:  [15-18] 18 (08/28 0606) BP: (94-111)/(45-64) 104/57 (08/28 0606) SpO2:  [96 %-100 %] 99 % (08/28 0606)  General: WDWN Caucasian man in NAD. Alert, oriented x4. Speech is clear without dysarthria. Affect is bright. Comportment is normal.  HEENT: Neck is supple without lymphadenopathy. Mucous membranes are moist and the oropharynx is clear. Sclerae are anicteric. There is no conjunctival injection. EEG electrodes in place over scalp.  CV:  Regular, no murmur. Carotid pulses are 2+ and symmetric with no bruits. Distal pulses 2+ and symmetric.  Lungs: CTAB  Extremities: No C/C/E. Neuro: MS: As noted above. No aphasia.  CN: Pupils are equal and reactive from 3-->2 mm bilaterally. EOMI, no nystagmus. Facial sensation is intact to light touch. Face is symmetric at rest with normal strength and mobility. Hearing is intact to conversational voice. Voice is normal in tone and quality. Palate elevates symmetrically. Uvula is midline. Bilateral SCM and trapezii are 5/5. Tongue is midline with normal bulk and mobility.  Motor: Normal bulk, tone, and strength throughout. No pronator drift. No tremor or other abnormal movements are observed.  Sensation: Intact to light touch and vibration.  DTRs: 2+, symmetric. Toes are downgoing bilaterally. No pathological reflexes.  Coordination: Finger-to-nose and heel-to-shin are without dysmetria bilaterally.  Gait: Deferred as he is being prepped for his EEG.   Labs: Lab Results  Component Value Date   WBC 7.9 07/31/2016   HGB 14.7 07/31/2016   HCT 44.9 07/31/2016   PLT 223 07/31/2016   GLUCOSE 106 (H) 07/31/2016   ALT 133 (H) 07/31/2016   AST 64 (H) 07/31/2016   NA 137 07/31/2016   K 4.4 07/31/2016   CL 105 07/31/2016   CREATININE 1.23 07/31/2016   BUN 6 07/31/2016   CO2 26 07/31/2016   TSH 0.974 07/10/2016   CBC Latest Ref Rng & Units 07/31/2016 07/25/2016 07/24/2016  WBC 4.0 - 10.5 K/uL 7.9 5.3 9.3  Hemoglobin 13.0 - 17.0 g/dL 14.7 14.0 14.9  Hematocrit 39.0 - 52.0 % 44.9 42.8 44.5  Platelets 150 - 400 K/uL 223 207 258     No results found for: HGBA1C Lab Results  Component Value Date   ALT 133 (H) 07/31/2016   AST 64 (H) 07/31/2016   ALKPHOS 87 07/31/2016   BILITOT 0.6 07/31/2016    Radiology:  I have personally an independently reviewed the MRI of the brain without contrast from 07/31/16. This is unremarkable.   Other diagnostic studies: EEG pending  EEG from 07/11/16  normal  A/P:   1. Possible seizure: The patient had a possible seizure on 07/30/16. Based on the description in the chart, it sounds as if there was some concern that the patient may be having nonepileptic seizure activity. It would be extremely unlikely to have seizure related to alcohol withdrawal this far out from his last intake, given that he is currently nine days removed from his last drink. On review of his labs, he did not have any significant abnormalities that would be likely to result in a provoked seizure. Nonepileptic seizure certainly must be considered but agree with pursuing further workup to rule out structural etiology for seizure. He has a normal MRI scan of the brain with a normal neurologic examination, making this unlikely. EEG is pending but he had a normal EEG on 07/11/16. At this point, there does not appear to be any role for antiepileptic medications. Continue to follow.  Continue seizure precautions. Sustained cessation from both alcohol and cocaine will be imperative to minimize his risk of any additional seizures moving forward.  2. Alcohol withdrawal: He is not measuring any evidence of alcohol withdrawal at this time. He is now 9 days out from his last drink. He has continued to receive Ativan per CIWA protocol. This is being tapered and he was just changed to dosing every 12 hours as needed today. His last dose was 2 mg of oral Ativan given at 0059 this morning. Continue tapering Ativan as tolerated.  3. Alcohol abuse: He has a long history of heavy alcohol use, reporting to me that he drinks 2 gallons of vodka daily. Other notes indicate that he has reported drinking 3 gallons of vodka daily. He states that he started drinking in 2009 following his divorce and has been a heavy drinker ever since. His longest period of sobriety was 6 months in 2012. At this point, sustained alcohol cessation will be the best way to prevent any further seizure activity. A formal alcohol  rehabilitation program is recommended.  Melba Coon, MD [facility]

## 2016-08-01 NOTE — Progress Notes (Signed)
EEG completed, results pending. 

## 2016-08-01 NOTE — Progress Notes (Addendum)
PROGRESS NOTE  Thomas Blake [id number] DOB: 1980/12/13 DOA: 07/24/2016 PCP: No PCP Per Patient  Brief History:  35 year old male with a history of alcohol drop seizure, alcohol abuse, cocaine abuse, tobacco abuse, testicular cancer s/p orchiectomy presented with hallucinations and wanting help with detox. Since admission to SDU, the patient was placed on Librium and alcohol withdrawal protocol requiring increasing amounts of lorazepam IV. He reports up to 3 gallons of vodka intake per day, last drink on the morning of 07/24/2016, also intermittently used cocaine PTA. He was transferred to the floor and librium tapered very slowly. Psychiatry was consulted for hallucinations, thought to be purely related to substance withdrawal and no medications recommended. Tapering of librium has continued, though consumption of ativan has not decreased appreciably, primarily for subjective complaints. Has had a couple episodes of shaking, concern for seizure vs. nonepileptic activity. Previous EEG neg, EEG is pending today. If this shows no evidence of seizure activity he may be discharged.   Assessment/Plan: Alcohol withdrawal: No evidence of DT's, VSS. CIWA scores remain elevated mostly due to subjective complaints which do not correlate with objective findings (e.g. frequent somnolence, tremor only present upon suggestion). Will continue to monitor CIWA.  - Continue ativan po q12h, q6h prn per CIWA.  - Continue thiamine, MVM - Social work consult for substance abuse resources, possible treatment facility disposition.  - Visual hallucinations may be related to PTSD vs. grief reaction to brother's death in war, and therefore may require outpatient psychiatric care.    Shaking episodes: Seizure vs. pseudoseizure. MRI negative. Noted episodes of shaking not consistent with classic seizure-like activity. MRI neg, neurological exam normal, neurology is consulted and EEG is pending.   Fall in hospital:  Episode 8/26 does not indicate seizure, with whole body shaking while interacting with nursing staff and no postictal period. He is neuro-intact currently. - Cervical XR neg - Fall precautions continue   Polysubstance abuse: Including alcohol, tobacco, cocaine - NicoDerm patch - Cessation discussed - CSW assistance appreciated  Testicular pain: U/S negative. 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  - Neg UA x2, XR Abd neg. - OxyIR q12h prn  Hypokalemia: Resolved s/p repletion with normal Mg. - Monitor as indicated  Restless leg syndrome: Chronic condition, has been off requip for 1 week, usually takes 3mg  TID.  - Reduced does restarted (1mg  TID) with hypotensive response to first dose, so returned to 0.25mg  TID. Will continue to monitor symptoms and BP. Doing well with this.   Insomnia: Chronic condition, also worsened by withdrawal symptoms.  - Restart home trazodone   Disposition Plan: Continue telemetry, D/C pending EEG.   Family Communication: None at bedside.   Consultants: psychiatry, Dr. Louretta Shorten  Code Status:  FULL   DVT Prophylaxis:  [place] Lovenox  Subjective: Pt reports improved sleep last night, generally feeling better.   Objective: Vitals:   07/31/16 1227 07/31/16 1845 08/01/16 0046 08/01/16 0606  BP: (!) 94/45 111/64 97/64 (!) 104/57  Pulse: 71 78 85 69  Resp: 15 15 16 18   Temp: 97.5 F (36.4 C) 98.4 F (36.9 C) 97.9 F (36.6 C) 97.5 F (36.4 C)  TempSrc: Oral Oral Oral Oral  SpO2: 96% 100% 97% 99%  Weight:      Height:        Intake/Output Summary (Last 24 hours) at 08/01/16 1021 Last data filed at 08/01/16 [id number]  Gross per 24 hour  Intake  0 ml  Output              750 ml  Net             -750 ml   Weight change:  Exam:   General:  Pt eating breakfast in bed. Not tremulous. No diaphoresis.  HEENT: No icterus, No thrush, No neck mass,  /AT  Neck: Full AROM, No midline tenderness.   Cardiovascular: RRR, S1/S2, no rubs, no gallops  Respiratory: CTA bilaterally, no wheezing, no crackles, no rhonchi  Abdomen: Soft/+BS, non tender, non distended, no guarding. No hernia upon provocation.   Extremities: No edema, No lymphangitis, No petechiae, No rashes, no synovitis.  Neuro: Alert, NOT postictal. Pupils small, symmetrical, reactive. CN II-XII intact. Speech normal. No dyskinesia or asterixis. Nonfocal.  Data Reviewed: I have personally reviewed following labs and imaging studies Basic Metabolic Panel:  Recent Labs Lab 07/26/16 0326 07/28/16 0546 07/30/16 0600 07/31/16 0621 07/31/16 1407  NA 138 137 139  --  137  K 4.0 3.9 3.8  --  4.4  CL 110 105 105  --  105  CO2 22 25 26   --  26  GLUCOSE 93 92 100*  --  106*  BUN 6 9 7   --  6  CREATININE 1.07 1.08 1.13 1.18 1.23  CALCIUM 8.8* 9.0 9.2  --  9.3  MG 2.0 2.0  --   --   --    Liver Function Tests:  Recent Labs Lab 07/31/16 1407  AST 64*  ALT 133*  ALKPHOS 87  BILITOT 0.6  PROT 5.7*  ALBUMIN 3.4*   No results for input(s): LIPASE, AMYLASE in the last 168 hours. No results for input(s): AMMONIA in the last 168 hours. Coagulation Profile: No results for input(s): INR, PROTIME in the last 168 hours. CBC:  Recent Labs Lab 07/31/16 1407  WBC 7.9  HGB 14.7  HCT 44.9  MCV 92.4  PLT 223   Cardiac Enzymes: No results for input(s): CKTOTAL, CKMB, CKMBINDEX, TROPONINI in the last 168 hours. BNP: Invalid input(s): POCBNP CBG: No results for input(s): GLUCAP in the last 168 hours. HbA1C: No results for input(s): HGBA1C in the last 72 hours. Urine analysis:    Component Value Date/Time   COLORURINE YELLOW 08/01/2016 0107   APPEARANCEUR CLEAR 08/01/2016 0107   LABSPEC 1.011 08/01/2016 0107   PHURINE 7.0 08/01/2016 0107   GLUCOSEU NEGATIVE 08/01/2016 0107   HGBUR SMALL (A) 08/01/2016 0107   BILIRUBINUR NEGATIVE 08/01/2016 0107   KETONESUR  NEGATIVE 08/01/2016 0107   PROTEINUR NEGATIVE 08/01/2016 0107   UROBILINOGEN 0.2 07/16/2010 2240   NITRITE NEGATIVE 08/01/2016 0107   LEUKOCYTESUR NEGATIVE 08/01/2016 0107   Sepsis Labs: @LABRCNTIP (procalcitonin:4,lacticidven:4) ) Recent Results (from the past 240 hour(s))  MRSA PCR Screening     Status: None   Collection Time: 07/24/16 10:44 AM  Result Value Ref Range Status   MRSA by PCR NEGATIVE NEGATIVE Final    Comment:        The GeneXpert MRSA Assay (FDA approved for NASAL specimens only), is one component of a comprehensive MRSA colonization surveillance program. It is not intended to diagnose MRSA infection nor to guide or monitor treatment for MRSA infections.   Urine culture     Status: Abnormal   Collection Time: 07/27/16  9:52 PM  Result Value Ref Range Status   Specimen Description URINE, RANDOM  Final   Special Requests NONE  Final   Culture <10,000 COLONIES/mL INSIGNIFICANT GROWTH (A)  Final   Report Status 07/29/2016 FINAL  Final     Scheduled Meds: . enoxaparin (LOVENOX) injection  40 mg Subcutaneous Q24H  . folic acid  1 mg Oral Daily  . LORazepam  0-4 mg Oral Q12H  . multivitamin with minerals  1 tablet Oral Daily  . nicotine  21 mg Transdermal Daily  . rOPINIRole  0.25 mg Oral TID  . sodium chloride  500 mL Intravenous Once  . thiamine  100 mg Oral Daily  . traZODone  100 mg Oral QHS   Continuous Infusions:   Procedures/Studies: Dg Chest 2 View  Result Date: 07/10/2016 CLINICAL DATA:  Left-sided chest pain for 1 hour. Vomiting earlier today. EXAM: CHEST  2 VIEW COMPARISON:  None. FINDINGS: The cardiomediastinal contours are normal. The lungs are clear. Pulmonary vasculature is normal. No consolidation, pleural effusion, or pneumothorax. No acute osseous abnormalities are seen. IMPRESSION: No acute pulmonary process. Electronically Signed   By: Jeb Levering M.D.   On: 07/10/2016 02:37   Dg Cervical Spine 2 Or 3 Views  Result Date:  07/30/2016 CLINICAL DATA:  Neck pain. EXAM: CERVICAL SPINE - 2-3 VIEW COMPARISON:  None FINDINGS: There is no evidence of cervical spine fracture or prevertebral soft tissue swelling. Alignment is normal. No other significant bone abnormalities are identified. IMPRESSION: Negative cervical spine radiographs. Electronically Signed   By: Kerby Moors M.D.   On: 07/30/2016 14:47   Ct Head Wo Contrast  Result Date: 07/10/2016 CLINICAL DATA:  Headache. Possible seizure activity. The known fall or trauma. Initial encounter. EXAM: CT HEAD WITHOUT CONTRAST TECHNIQUE: Contiguous axial images were obtained from the base of the skull through the vertex without intravenous contrast. COMPARISON:  None. FINDINGS: There is no evidence of acute cortical infarct, intracranial hemorrhage, mass, midline shift, or extra-axial fluid collection. The ventricles and sulci are normal. Orbits are unremarkable. The frontal and maxillary sinuses are hypoplastic. There is no evidence of acute inflammatory change in the sinuses or mastoid air cells. No acute osseous abnormality is identified. IMPRESSION: Unremarkable CT appearance of the brain. Electronically Signed   By: Logan Bores M.D.   On: 07/10/2016 11:05   Mr Brain Wo Contrast  Result Date: 07/31/2016 CLINICAL DATA:  Hallucinations, requesting detox. History of alcohol abuse, cocaine abuse, testicular cancer. EXAM: MRI HEAD WITHOUT CONTRAST TECHNIQUE: Multiplanar, multiecho pulse sequences of the brain and surrounding structures were obtained without intravenous contrast. COMPARISON:  CT HEAD July 10, 2016 FINDINGS: INTRACRANIAL CONTENTS: No reduced diffusion to suggest acute ischemia or postictal changes. No susceptibility artifact to suggest hemorrhage. The ventricles and sulci are normal for patient's age. No suspicious parenchymal signal, masses or mass effect. No abnormal extra-axial fluid collections. No extra-axial masses though, contrast enhanced sequences would be more  sensitive. Normal major intracranial vascular flow voids present at skull base. Normal symmetric size, morphology and signal of the hippocampi. ORBITS: The included ocular globes and orbital contents are non-suspicious. SINUSES: Chronic maxillary sinusitis.  Hypoplastic frontal sinuses. SKULL/SOFT TISSUES: No abnormal sellar expansion. No suspicious calvarial bone marrow signal. Craniocervical junction maintained. Prominent lymph nodes in the neck are likely reactive. IMPRESSION: Normal noncontrast MRI head. Electronically Signed   By: Elon Alas M.D.   On: 07/31/2016 05:38   US Scrotum  Result Date: 07/24/2016 CLINICAL DATA:  Right testicular pain. History of testicular cancer in 2009 EXAM: [place] TESTICLES TECHNIQUE: Complete ultrasound examination of the testicles, epididymis, and other scrotal structures was performed. Color and spectral Doppler  ultrasound were also utilized to evaluate blood flow to the testicles. COMPARISON:  07/16/2010 FINDINGS: Right testicle Measurements: 48 x 21 x 28 mm. No mass or microlithiasis visualized. Expected vascularity. Left testicle Surgically absent Right epididymis:  Normal in size and vascularity. Left epididymis:  Surgically absent Hydrocele:  None visualized. Varicocele:  None visualized. Pulsed Doppler interrogation of the right testicle demonstrates normal low resistance arterial and venous waveforms bilaterally. IMPRESSION: 1. No explanation for pain. 2. Left orchiectomy. Electronically Signed   By: Monte Fantasia M.D.   On: 07/24/2016 19:54   [place] Art/ven Flow Abd Pelv Doppler  Result Date: 07/24/2016 CLINICAL DATA:  Right testicular pain. History of testicular cancer in 2009 EXAM: [place] TESTICLES TECHNIQUE: Complete ultrasound examination of the testicles, epididymis, and other scrotal structures was performed. Color and spectral Doppler ultrasound were also utilized to evaluate  blood flow to the testicles. COMPARISON:  07/16/2010 FINDINGS: Right testicle Measurements: 48 x 21 x 28 mm. No mass or microlithiasis visualized. Expected vascularity. Left testicle Surgically absent Right epididymis:  Normal in size and vascularity. Left epididymis:  Surgically absent Hydrocele:  None visualized. Varicocele:  None visualized. Pulsed Doppler interrogation of the right testicle demonstrates normal low resistance arterial and venous waveforms bilaterally. IMPRESSION: 1. No explanation for pain. 2. Left orchiectomy. Electronically Signed   By: Monte Fantasia M.D.   On: 07/24/2016 19:54   Dg Abd Portable 1v  Result Date: 07/31/2016 CLINICAL DATA:  Feels like he may have a kidney stone. EXAM: PORTABLE ABDOMEN - 1 VIEW COMPARISON:  None. FINDINGS: No evidence for radiopaque stone over the expected location of either kidney or ureter. Urinary bladder has not been included on the study. Visualized bony anatomy is unremarkable. IMPRESSION: No evidence for renal or ureteral stone. Bladder has not been included on the film. Electronically Signed   By: Misty Stanley M.D.   On: 07/31/2016 21:14   Vance Gather, MD  [facility] Pager [phone]  If 7PM-7AM, please contact night-coverage [email or web address] Password [email or web address] 08/01/2016, 10:21 AM   LOS: 8 days

## 2016-08-02 ENCOUNTER — Encounter (HOSPITAL_COMMUNITY): Payer: Self-pay | Admitting: Emergency Medicine

## 2016-08-02 ENCOUNTER — Emergency Department (HOSPITAL_COMMUNITY)
Admission: EM | Admit: 2016-08-02 | Discharge: 2016-08-03 | Disposition: A | Discharge status: Home / Self Care | Payer: Self-pay | Attending: Emergency Medicine | Admitting: Emergency Medicine

## 2016-08-02 ENCOUNTER — Emergency Department (HOSPITAL_COMMUNITY): Payer: Self-pay

## 2016-08-02 DIAGNOSIS — R220 Localized swelling, mass and lump, head: Secondary | ICD-10-CM | POA: Insufficient documentation

## 2016-08-02 DIAGNOSIS — Y999 Unspecified external cause status: Secondary | ICD-10-CM | POA: Insufficient documentation

## 2016-08-02 DIAGNOSIS — Y9301 Activity, walking, marching and hiking: Secondary | ICD-10-CM | POA: Insufficient documentation

## 2016-08-02 DIAGNOSIS — G40909 Epilepsy, unspecified, not intractable, without status epilepticus: Secondary | ICD-10-CM | POA: Insufficient documentation

## 2016-08-02 DIAGNOSIS — H532 Diplopia: Secondary | ICD-10-CM

## 2016-08-02 DIAGNOSIS — R51 Headache: Secondary | ICD-10-CM | POA: Insufficient documentation

## 2016-08-02 DIAGNOSIS — R569 Unspecified convulsions: Secondary | ICD-10-CM

## 2016-08-02 DIAGNOSIS — Z79899 Other long term (current) drug therapy: Secondary | ICD-10-CM | POA: Insufficient documentation

## 2016-08-02 DIAGNOSIS — Y929 Unspecified place or not applicable: Secondary | ICD-10-CM | POA: Insufficient documentation

## 2016-08-02 DIAGNOSIS — W1839XA Other fall on same level, initial encounter: Secondary | ICD-10-CM | POA: Insufficient documentation

## 2016-08-02 DIAGNOSIS — Z8547 Personal history of malignant neoplasm of testis: Secondary | ICD-10-CM | POA: Insufficient documentation

## 2016-08-02 DIAGNOSIS — Z87891 Personal history of nicotine dependence: Secondary | ICD-10-CM | POA: Insufficient documentation

## 2016-08-02 LAB — I-STAT CHEM 8, ED
BUN: 11 mg/dL (ref 6–20)
CALCIUM ION: 1.14 mmol/L — AB (ref 1.15–1.40)
CREATININE: 1.1 mg/dL (ref 0.61–1.24)
Chloride: 101 mmol/L (ref 101–111)
GLUCOSE: 103 mg/dL — AB (ref 65–99)
HCT: 47 % (ref 39.0–52.0)
HEMOGLOBIN: 16 g/dL (ref 13.0–17.0)
Potassium: 4.2 mmol/L (ref 3.5–5.1)
Sodium: 138 mmol/L (ref 135–145)
TCO2: 28 mmol/L (ref 0–100)

## 2016-08-02 MED ORDER — SODIUM CHLORIDE 0.9 % IV SOLN
1000.0000 mg | Freq: Once | INTRAVENOUS | Status: AC
  Administered 2016-08-03: 1000 mg via INTRAVENOUS
  Filled 2016-08-02: qty 10

## 2016-08-02 MED ORDER — TRAZODONE HCL 100 MG PO TABS: 100.0000 mg | ORAL_TABLET | Freq: Every day | ORAL | 0 refills | Status: AC

## 2016-08-02 MED ORDER — SODIUM CHLORIDE 0.9 % IV BOLUS (SEPSIS)
500.0000 mL | Freq: Once | INTRAVENOUS | Status: AC
  Administered 2016-08-02: 500 mL via INTRAVENOUS

## 2016-08-02 MED ORDER — MELOXICAM 15 MG PO TABS: 15.0000 mg | ORAL_TABLET | Freq: Every day | ORAL | 0 refills | Status: AC

## 2016-08-02 MED ORDER — LEVETIRACETAM 500 MG PO TABS: 500.0000 mg | ORAL_TABLET | Freq: Two times a day (BID) | ORAL | 0 refills | Status: AC

## 2016-08-02 MED ORDER — KETOROLAC TROMETHAMINE 30 MG/ML IJ SOLN
30.0000 mg | Freq: Once | INTRAMUSCULAR | Status: AC
  Administered 2016-08-03: 30 mg via INTRAVENOUS
  Filled 2016-08-02: qty 1

## 2016-08-02 MED ORDER — ROPINIROLE HCL 1 MG PO TABS: 1.0000 mg | ORAL_TABLET | Freq: Three times a day (TID) | ORAL | 0 refills | Status: AC

## 2016-08-02 NOTE — ED Triage Notes (Signed)
Pt arrives to E45 at this time via [facility] stretcher.  Per EMS pt had seizure like activity and pt fell.  Per EMS pt was not postictal upon there arrival.  Pt reports pain to his head 8/10 at this time.   Chief Complaint  Patient presents with  . Seizures   Past Medical History:  Diagnosis Date  . AKI (acute kidney injury) ([place])   . Cancer [facility]) 2009   testicular  . ETOH abuse   . Seizure ([place])   . Tobacco abuse

## 2016-08-02 NOTE — ED Provider Notes (Signed)
[facility] DEPT Provider Note   CSN: [id number] Arrival date & time: 08/02/16  2139     History   Chief Complaint Chief Complaint  Patient presents with  . Seizures    HPI Thomas Blake is a 35 y.o. male.  Patient has a history of prior alcohol abuse and alcohol withdrawal seizures. He was recently admitted for alcohol withdrawal symptoms and was discharged earlier today. He states he was walking with his brother and had a seizure. He states his brother said he was little bit confused and then fell to the pavement where he had twitching of all his extremities. It's unclear how long this lasted. Patient complains of a bad right-sided headache and right-sided facial pain from the fall. He did not bite his tongue. There is no incontinence. He was alert and oriented when EMS arrived. Patient states he has only had seizures related to his alcohol use. There is no prior history of seizures. He's never been on any antiepileptic medications. He denies any other injury from a fall today.    Seizures   Associated symptoms include headaches. Pertinent negatives include no speech difficulty, no chest pain, no cough, no nausea, no vomiting and no diarrhea.    Past Medical History:  Diagnosis Date  . AKI (acute kidney injury) ([place])   . 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 ETOH abuse   . Seizure ([place])   . Tobacco abuse     Patient Active Problem List   Diagnosis Date Noted  . Alcohol withdrawal ([place]) 07/24/2016  . Acute hypokalemia 07/24/2016  . Mild dehydration 07/24/2016  . 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 Tobacco abuse 07/10/2016  . Cocaine abuse 07/10/2016  . Nonepileptic episode ([place])   . ETOH abuse   . Cancer ([place]) 12/06/2007    Past Surgical History:  Procedure Laterality Date  . ORCHIECTOMY  2009       Home Medications    Prior to Admission medications   Medication Sig Start Date End Date Taking? Authorizing Provider  Multiple Vitamin (MULTIVITAMIN WITH  MINERALS) TABS tablet Take 1 tablet by mouth daily.   Yes Historical Provider, MD  folic acid (FOLVITE) 1 MG tablet Take 1 tablet (1 mg total) by mouth daily. 07/12/16   Jani Gravel, MD  levETIRAcetam (KEPPRA) 500 MG tablet Take 1 tablet (500 mg total) by mouth 2 (two) times daily. 08/02/16   Malvin Johns, MD  meloxicam (MOBIC) 15 MG tablet Take 1 tablet (15 mg total) by mouth daily. 08/02/16   Patrecia Pour, MD  rOPINIRole (REQUIP) 1 MG tablet Take 1 tablet (1 mg total) by mouth 3 (three) times daily. 08/02/16   Patrecia Pour, MD  thiamine 100 MG tablet Take 1 tablet (100 mg total) by mouth daily. 07/12/16   Jani Gravel, MD  traZODone (DESYREL) 100 MG tablet Take 1 tablet (100 mg total) by mouth at bedtime. 08/02/16   Patrecia Pour, MD    Family History Family History  Problem Relation Age of Onset  . Diabetes Mother   . Diabetes Father   . CAD Father   . Microcephaly Father     Social History Social History  Substance Use Topics  . Smoking status: Former Smoker    Types: Cigarettes  . Smokeless tobacco: Never Used  . Alcohol use Yes     Comment: 1/2 gallon of vodka daily     Allergies   Tramadol   Review of Systems Review of Systems  Constitutional: Negative  for chills, diaphoresis, fatigue and fever.  HENT: Positive for facial swelling. Negative for congestion, rhinorrhea and sneezing.   Eyes: Negative.   Respiratory: Negative for cough, chest tightness and shortness of breath.   Cardiovascular: Negative for chest pain and leg swelling.  Gastrointestinal: Negative for abdominal pain, blood in stool, diarrhea, nausea and vomiting.  Genitourinary: Negative for difficulty urinating, flank pain, frequency and hematuria.  Musculoskeletal: Negative for arthralgias and back pain.  Skin: Negative for rash.  Neurological: Positive for seizures and headaches. Negative for dizziness, speech difficulty, weakness and numbness.     Physical Exam Updated Vital Signs BP 171/90 (BP Location:  Right Arm)   Pulse 91   Temp 98.3 F (36.8 C) (Oral)   Resp 16   Ht 5\' 10"  (1.778 m)   Wt 185 lb (83.9 kg)   SpO2 98%   BMI 26.54 kg/m   Physical Exam  Constitutional: He is oriented to person, place, and time. He appears well-developed and well-nourished.  HENT:  Head: Normocephalic.  Mild swelling and tenderness on palpation to the right zygoma. There is no malalignment of the jaw. No other facial tenderness is noted.  Eyes: Pupils are equal, round, and reactive to light.  Neck: Normal range of motion. Neck supple.  No pain on palpation of the cervical thoracic or lumbosacral spine  Cardiovascular: Normal rate, regular rhythm and normal heart sounds.   Pulmonary/Chest: Effort normal and breath sounds normal. No respiratory distress. He has no wheezes. He has no rales. He exhibits no tenderness.  Abdominal: Soft. Bowel sounds are normal. There is no tenderness. There is no rebound and no guarding.  Musculoskeletal: Normal range of motion. He exhibits no edema.  Patient has a mild resting tremor  Lymphadenopathy:    He has no cervical adenopathy.  Neurological: He is alert and oriented to person, place, and time. He has normal strength. No cranial nerve deficit or sensory deficit.  Skin: Skin is warm and dry. No rash noted.  Psychiatric: He has a normal mood and affect.     ED Treatments / Results  Labs (all labs ordered are listed, but only abnormal results are displayed) Labs Reviewed  URINE RAPID DRUG SCREEN, HOSP PERFORMED - Abnormal; Notable for the following:       Result Value   Cocaine POSITIVE (*)    Benzodiazepines POSITIVE (*)    All other components within normal limits  I-STAT CHEM 8, ED - Abnormal; Notable for the following:    Glucose, Bld 103 (*)    Calcium, Ion 1.14 (*)    All other components within normal limits  ETHANOL    EKG  EKG Interpretation  Date/Time:  Tuesday August 02 2016 21:39:37 EDT Ventricular Rate:  92 PR Interval:    QRS  Duration: 72 QT Interval:  327 QTC Calculation: 405 R Axis:   30 Text Interpretation:  Sinus rhythm ST elev, probable normal early repol pattern since last tracing no significant change Confirmed by Kedar Sedano  MD, Dhanya Bogle ([id number]) on 08/02/2016 9:50:16 PM       Radiology Ct Head Wo Contrast  Result Date: 08/02/2016 CLINICAL DATA:  Seizure. Status post fall. Currently in alcohol detox. EXAM: CT HEAD WITHOUT CONTRAST CT MAXILLOFACIAL WITHOUT CONTRAST TECHNIQUE: Multidetector CT imaging of the head and maxillofacial structures were performed using the standard protocol without intravenous contrast. Multiplanar CT image reconstructions of the maxillofacial structures were also generated. COMPARISON:  Brain MRI 07/31/2016 FINDINGS: CT HEAD FINDINGS Brain: There is no mass lesion,  intraparenchymal hemorrhage or extra-axial collection. No evidence of acute cortical infarct. No hydrocephalus. Brain parenchyma and CSF-containing spaces are normal for age. Vascular: No hyperdense vessel or unexpected calcification. CT MAXILLOFACIAL FINDINGS Complex facial fracture types: No LeFort, zygomaticomaxillary complex or nasoorbitoethmoidal fracture. Simple fracture types: None. Orbits: Globes appear intact. Normal intra- and extraconal fat. Paranasal sinuses and mastoids: Free of fluid. Hypoplastic maxillary sinuses. Calvarium and skull base No fracture identified. Extracranial soft tissues: Bilateral cervical level 2 lymph nodes measuring up to 1.4 cm. IMPRESSION: 1. No acute intracranial abnormality.  Normal head CT. 2. No acute facial fracture. 3. Bilateral mildly enlarged cervical lymph nodes. Electronically Signed   By: Ulyses Jarred M.D.   On: 08/02/2016 22:46   Ct Maxillofacial Wo Contrast  Result Date: 08/02/2016 CLINICAL DATA:  Seizure. Status post fall. Currently in alcohol detox. EXAM: CT HEAD WITHOUT CONTRAST CT MAXILLOFACIAL WITHOUT CONTRAST TECHNIQUE: Multidetector CT imaging of the head and maxillofacial  structures were performed using the standard protocol without intravenous contrast. Multiplanar CT image reconstructions of the maxillofacial structures were also generated. COMPARISON:  Brain MRI 07/31/2016 FINDINGS: CT HEAD FINDINGS Brain: There is no mass lesion, intraparenchymal hemorrhage or extra-axial collection. No evidence of acute cortical infarct. No hydrocephalus. Brain parenchyma and CSF-containing spaces are normal for age. Vascular: No hyperdense vessel or unexpected calcification. CT MAXILLOFACIAL FINDINGS Complex facial fracture types: No LeFort, zygomaticomaxillary complex or nasoorbitoethmoidal fracture. Simple fracture types: None. Orbits: Globes appear intact. Normal intra- and extraconal fat. Paranasal sinuses and mastoids: Free of fluid. Hypoplastic maxillary sinuses. Calvarium and skull base No fracture identified. Extracranial soft tissues: Bilateral cervical level 2 lymph nodes measuring up to 1.4 cm. IMPRESSION: 1. No acute intracranial abnormality.  Normal head CT. 2. No acute facial fracture. 3. Bilateral mildly enlarged cervical lymph nodes. Electronically Signed   By: Ulyses Jarred M.D.   On: 08/02/2016 22:46    Procedures Procedures (including critical care time)  Medications Ordered in ED Medications  ketorolac (TORADOL) 30 MG/ML injection 30 mg (not administered)  levETIRAcetam (KEPPRA) 1,000 mg in sodium chloride 0.9 % 100 mL IVPB (not administered)  sodium chloride 0.9 % bolus 500 mL (0 mLs Intravenous Stopped 08/02/16 2341)     Initial Impression / Assessment and Plan / ED Course  I have reviewed the triage vital signs and the nursing notes.  Pertinent labs & imaging results that were available during my care of the patient were reviewed by me and considered in my medical decision making (see chart for details).  Clinical Course    Patient presents after a witnessed seizure. I don't feel that the seizures consistent with alcohol withdrawal seizures given that  he just had an extensive period of alcohol detox in the hospital. He was just discharged today. He's currently at baseline and neurologically intact. I consulted with Dr. Leonel Ramsay who recommended starting the patient on Keppra. I will arrange for an ambulatory referral to outpatient neurology. Return precautions were given.  Final Clinical Impressions(s) / ED Diagnoses   Final diagnoses:  Seizure ([place])    New Prescriptions New Prescriptions   LEVETIRACETAM (KEPPRA) 500 MG TABLET    Take 1 tablet (500 mg total) by mouth 2 (two) times daily.     Malvin Johns, MD 08/03/16 [phone]

## 2016-08-02 NOTE — ED Notes (Signed)
Pt returned from radiology at this time via ED stretcher. Pt in no apparent distress at this time.  Pt replaced on the cardiac monitor at this time.  Will continue to closely monitor pt.

## 2016-08-02 NOTE — Progress Notes (Signed)
Nsg Discharge Note  Admit Date:  07/24/2016 Discharge date: 08/02/2016   Thomas Blake to be D/C'd Home per MD order.  AVS completed.  Copy for chart, and copy for patient signed, and dated. Patient/caregiver able to verbalize understanding.  Discharge Medication:   Medication List    STOP taking these medications   ASPIRIN-ACETAMINOPHEN-CAFFEINE PO   clonazePAM 1 MG tablet Commonly known as:  KLONOPIN     TAKE these medications   folic acid 1 MG tablet Commonly known as:  FOLVITE Take 1 tablet (1 mg total) by mouth daily.   meloxicam 15 MG tablet Commonly known as:  MOBIC Take 1 tablet (15 mg total) by mouth daily.   multivitamin with minerals Tabs tablet Take 1 tablet by mouth daily.   rOPINIRole 1 MG tablet Commonly known as:  REQUIP Take 1 tablet (1 mg total) by mouth 3 (three) times daily. What changed:  medication strength  how much to take   thiamine 100 MG tablet Take 1 tablet (100 mg total) by mouth daily.   traZODone 100 MG tablet Commonly known as:  DESYREL Take 100 mg by mouth at bedtime. What changed:  Another medication with the same name was added. Make sure you understand how and when to take each.   traZODone 100 MG tablet Commonly known as:  DESYREL Take 1 tablet (100 mg total) by mouth at bedtime. What changed:  You were already taking a medication with the same name, and this prescription was added. Make sure you understand how and when to take each.       Discharge Assessment: Vitals:   08/02/16 0025 08/02/16 0611  BP: (!) 93/45 (!) 85/58  Pulse: 63 63  Resp: 20 20  Temp: 97.4 F (36.3 C) 98 F (36.7 C)   Skin clean, dry and intact without evidence of skin break down, no evidence of skin tears noted. IV catheter discontinued intact. Site without signs and symptoms of complications - no redness or edema noted at insertion site, patient denies c/o pain - only slight tenderness at site.  Dressing with slight pressure applied.  D/c  Instructions-Education: Discharge instructions given to patient/family with verbalized understanding. D/c education completed with patient/family including follow up instructions, medication list, d/c activities limitations if indicated, with other d/c instructions as indicated by MD - patient able to verbalize understanding, all questions fully answered. Patient instructed to return to ED, call 911, or call MD for any changes in condition.  Patient escorted via [place], and D/C home via private auto.  Dayle Points, RN 08/02/2016 12:41 PM

## 2016-08-02 NOTE — Discharge Summary (Signed)
Physician Discharge Summary  Thomas Blake [id number] DOB: [birth year] DOA: 07/24/2016  PCP: No PCP Per Patient  Admit date: 07/24/2016 Discharge date: 08/02/2016  Admitted From: Home Disposition: Discharged to home with list of shelters and recovery services   Recommendations for Outpatient Follow-up:  1. Follow up with PCP in 1-2 weeks; recommended to establish care at [facility] if not with PCP in [place].  2. Will need ongoing counseling for substance abuse.  3. Consider optometry referral if horizontal diplopia continues.   Home Health: None Equipment/Devices: None  Discharge Condition: Stable CODE STATUS: Full Diet recommendation: Regular  Brief/Interim Summary: 35 year old male with a history of alcohol drop seizure, alcohol abuse, cocaine abuse, tobacco abuse, testicular cancer s/p orchiectomy presented with hallucinations and wanting help with alcohol detox (reported 3 gallons of vodka per day and cocaine use PTA). He was admitted to SDU, placed on CIWA protocol with librium taper. He stabilized and was transferred to the floor. He was seen by psychiatry for hallucinations, thought to be purely related to substance withdrawal and no medications recommended. Tapering of librium continued slowly due to continued use of ativan (as directed for higher CIWA scores, mostly anxiety and perceptual disturbances in absence of objective findings). He had a couple episodes of shaking and a fall unwitnessed with negative XR's and mild neck pain which has improved. Due to concern for seizure vs. nonepileptic activity, neurology was consulted. MRI of the brain was negative. EEGs performed on 8/7 and again 8/28 were negative. Neurology concurs that this was not consistent with withdrawal seizures, especially since last drink was 8/18. CXW provided resources on discharge for treatment facilities.   Discharge Diagnoses:  Principal Problem:   Alcohol withdrawal ([facility]) Active  Problems:   Nonepileptic episode (HCC)   ETOH abuse   Tobacco abuse   Cocaine abuse   Acute hypokalemia   Mild dehydration   Hypoproteinemia (HCC)   Testicular pain: U/S negative. 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  - Neg UA x2, XR Abd neg. - Prescribed mobic prn on discharge.  Restless leg syndrome: Chronic condition, has been off requip for 1 week, usually takes 3mg  TID.  - Reduced dose restarted and prescribed on discharge pending f/u with PCP  Insomnia: Chronic condition, also worsened by withdrawal symptoms.  - Restart home trazodone. Prescribed at discharge pending f/u with PCP.    Discharge Instructions Discharge Instructions    Discharge instructions    Complete by:  As directed   You were admitted for supervised treatment of acute alcohol withdrawal. This has been completed and you are clear for discharge. You should continue to abstain from alcohol and enter treatment program if needed.   TAKE mobic as neede and as directed for the pain in your right lower abdomen/pelvis. Follow up with your doctor for further management.  TAKE trazodone and requip as directed. Follow up with you PCP to discuss increasing the dose of requip.   Your MRI and EEG's showed no evidence of anything that would have caused a seizure, and you will not have a seizure this far out from your last drink. All other symptoms will continue to resolve as long as you don't drink. If you experience any seizure activity you need to return for evaluation.       Medication List    STOP taking these medications   ASPIRIN-ACETAMINOPHEN-CAFFEINE PO   clonazePAM 1 MG tablet Commonly known as:  Bobbye [place]  TAKE these medications   folic acid 1 MG tablet Commonly known as:  FOLVITE Take 1 tablet (1 mg total) by mouth daily.   meloxicam 15 MG tablet Commonly known as:  MOBIC Take 1 tablet (15 mg total) by mouth  daily.   multivitamin with minerals Tabs tablet Take 1 tablet by mouth daily.   rOPINIRole 1 MG tablet Commonly known as:  REQUIP Take 1 tablet (1 mg total) by mouth 3 (three) times daily. What changed:  medication strength  how much to take   thiamine 100 MG tablet Take 1 tablet (100 mg total) by mouth daily.   traZODone 100 MG tablet Commonly known as:  DESYREL Take 100 mg by mouth at bedtime. What changed:  Another medication with the same name was added. Make sure you understand how and when to take each.   traZODone 100 MG tablet Commonly known as:  DESYREL Take 1 tablet (100 mg total) by mouth at bedtime. What changed:  You were already taking a medication with the same name, and this prescription was added. Make sure you understand how and when to take each.      Follow-up Information    [facility]. Schedule an appointment as soon as possible for a visit today.   Why:  Call to schedule your appointment. If you do not show up to your scheduled appointment again, they will not accept further appointments. Contact information: [place] [phone]         Allergies  Allergen Reactions  . Tramadol Other (See Comments)    Possibly caused seizure/ shaking    Consultations:  Psychiatry, Dr. Louretta Shorten   Neurology, Dr. Ayesha Mohair  Procedures/Studies: Dg Chest 2 View  Result Date: 07/10/2016 CLINICAL DATA:  Left-sided chest pain for 1 hour. Vomiting earlier today. EXAM: CHEST  2 VIEW COMPARISON:  None. FINDINGS: The cardiomediastinal contours are normal. The lungs are clear. Pulmonary vasculature is normal. No consolidation, pleural effusion, or pneumothorax. No acute osseous abnormalities are seen. IMPRESSION: No acute pulmonary process. Electronically Signed   By: Jeb Levering M.D.   On: 07/10/2016 02:37   Dg Cervical Spine 2 Or 3 Views  Result Date: 07/30/2016 CLINICAL  DATA:  Neck pain. EXAM: CERVICAL SPINE - 2-3 VIEW COMPARISON:  None FINDINGS: There is no evidence of cervical spine fracture or prevertebral soft tissue swelling. Alignment is normal. No other significant bone abnormalities are identified. IMPRESSION: Negative cervical spine radiographs. Electronically Signed   By: Kerby Moors M.D.   On: 07/30/2016 14:47   Ct Head Wo Contrast  Result Date: 07/10/2016 CLINICAL DATA:  Headache. Possible seizure activity. The known fall or trauma. Initial encounter. EXAM: CT HEAD WITHOUT CONTRAST TECHNIQUE: Contiguous axial images were obtained from the base of the skull through the vertex without intravenous contrast. COMPARISON:  None. FINDINGS: There is no evidence of acute cortical infarct, intracranial hemorrhage, mass, midline shift, or extra-axial fluid collection. The ventricles and sulci are normal. Orbits are unremarkable. The frontal and maxillary sinuses are hypoplastic. There is no evidence of acute inflammatory change in the sinuses or mastoid air cells. No acute osseous abnormality is identified. IMPRESSION: Unremarkable CT appearance of the brain. Electronically Signed   By: Logan Bores M.D.   On: 07/10/2016 11:05   Mr Brain Wo Contrast  Result Date: 07/31/2016 CLINICAL DATA:  Hallucinations, requesting detox. History of alcohol abuse, cocaine abuse, testicular cancer. EXAM: MRI HEAD WITHOUT CONTRAST TECHNIQUE: Multiplanar,  multiecho pulse sequences of the brain and surrounding structures were obtained without intravenous contrast. COMPARISON:  CT HEAD July 10, 2016 FINDINGS: INTRACRANIAL CONTENTS: No reduced diffusion to suggest acute ischemia or postictal changes. No susceptibility artifact to suggest hemorrhage. The ventricles and sulci are normal for patient's age. No suspicious parenchymal signal, masses or mass effect. No abnormal extra-axial fluid collections. No extra-axial masses though, contrast enhanced sequences would be more sensitive. Normal  major intracranial vascular flow voids present at skull base. Normal symmetric size, morphology and signal of the hippocampi. ORBITS: The included ocular globes and orbital contents are non-suspicious. SINUSES: Chronic maxillary sinusitis.  Hypoplastic frontal sinuses. SKULL/SOFT TISSUES: No abnormal sellar expansion. No suspicious calvarial bone marrow signal. Craniocervical junction maintained. Prominent lymph nodes in the neck are likely reactive. IMPRESSION: Normal noncontrast MRI head. Electronically Signed   By: Elon Alas M.D.   On: 07/31/2016 05:38   US Scrotum  Result Date: 07/24/2016 CLINICAL DATA:  Right testicular pain. History of testicular cancer in 2009 EXAM: [facility] TESTICLES TECHNIQUE: Complete ultrasound examination of the testicles, epididymis, and other scrotal structures was performed. Color and spectral Doppler ultrasound were also utilized to evaluate blood flow to the testicles. COMPARISON:  07/16/2010 FINDINGS: Right testicle Measurements: 48 x 21 x 28 mm. No mass or microlithiasis visualized. Expected vascularity. Left testicle Surgically absent Right epididymis:  Normal in size and vascularity. Left epididymis:  Surgically absent Hydrocele:  None visualized. Varicocele:  None visualized. Pulsed Doppler interrogation of the right testicle demonstrates normal low resistance arterial and venous waveforms bilaterally. IMPRESSION: 1. No explanation for pain. 2. Left orchiectomy. Electronically Signed   By: Monte Fantasia M.D.   On: 07/24/2016 19:54   [place] Art/ven Flow Abd Pelv Doppler  Result Date: 07/24/2016 CLINICAL DATA:  Right testicular pain. History of testicular cancer in 2009 EXAM: [facility] TESTICLES TECHNIQUE: Complete ultrasound examination of the testicles, epididymis, and other scrotal structures was performed. Color and spectral Doppler ultrasound were also utilized to evaluate blood flow to the  testicles. COMPARISON:  07/16/2010 FINDINGS: Right testicle Measurements: 48 x 21 x 28 mm. No mass or microlithiasis visualized. Expected vascularity. Left testicle Surgically absent Right epididymis:  Normal in size and vascularity. Left epididymis:  Surgically absent Hydrocele:  None visualized. Varicocele:  None visualized. Pulsed Doppler interrogation of the right testicle demonstrates normal low resistance arterial and venous waveforms bilaterally. IMPRESSION: 1. No explanation for pain. 2. Left orchiectomy. Electronically Signed   By: Monte Fantasia M.D.   On: 07/24/2016 19:54   Dg Abd Portable 1v  Result Date: 07/31/2016 CLINICAL DATA:  Feels like he may have a kidney stone. EXAM: PORTABLE ABDOMEN - 1 VIEW COMPARISON:  None. FINDINGS: No evidence for radiopaque stone over the expected location of either kidney or ureter. Urinary bladder has not been included on the study. Visualized bony anatomy is unremarkable. IMPRESSION: No evidence for renal or ureteral stone. Bladder has not been included on the film. Electronically Signed   By: Misty Stanley M.D.   On: 07/31/2016 21:14      Subjective: Pt feels better this morning, speaking with brother in the room. No falls, shaking. Slept well. Ready for discharge.   Discharge Exam: Vitals:   08/02/16 0025 08/02/16 0611  BP: (!) 93/45 (!) 85/58  Pulse: 63 63  Resp: 20 20  Temp: 97.4 F (36.3 C) 98 F (36.7 C)   Vitals:   08/01/16 1346 08/01/16 2226 08/02/16  0025 08/02/16 0611  BP: (!) 105/54 99/63 (!) 93/45 (!) 85/58  Pulse: 74 89 63 63  Resp:  20 20 20   Temp: 98 F (36.7 C) 97.6 F (36.4 C) 97.4 F (36.3 C) 98 F (36.7 C)  TempSrc:  Oral Oral Oral  SpO2: 99% 95% 96% 98%  Weight:      Height:       General: Pt is alert, awake, not in acute distress eating breakfast Cardiovascular: RRR, S1/S2 +, no rubs, no gallops Respiratory: CTA bilaterally, no wheezing, no rhonchi Abdominal: Soft, NT, ND, bowel sounds + Extremities: no  edema, no cyanosis Neuro: No tremors, A&Ox3, nonfocal.   The results of significant diagnostics from this hospitalization (including imaging, microbiology, ancillary and laboratory) are listed below for reference.    Microbiology: Recent Results (from the past 240 hour(s))  MRSA PCR Screening     Status: None   Collection Time: 07/24/16 10:44 AM  Result Value Ref Range Status   MRSA by PCR NEGATIVE NEGATIVE Final    Comment:        The GeneXpert MRSA Assay (FDA approved for NASAL specimens only), is one component of a comprehensive MRSA colonization surveillance program. It is not intended to diagnose MRSA infection nor to guide or monitor treatment for MRSA infections.   Urine culture     Status: Abnormal   Collection Time: 07/27/16  9:52 PM  Result Value Ref Range Status   Specimen Description URINE, RANDOM  Final   Special Requests NONE  Final   Culture <10,000 COLONIES/mL INSIGNIFICANT GROWTH (A)  Final   Report Status 07/29/2016 FINAL  Final     Labs: BNP (last 3 results) No results for input(s): BNP in the last 8760 hours. Basic Metabolic Panel:  Recent Labs Lab 07/28/16 0546 07/30/16 0600 07/31/16 0621 07/31/16 1407  NA 137 139  --  137  K 3.9 3.8  --  4.4  CL 105 105  --  105  CO2 25 26  --  26  GLUCOSE 92 100*  --  106*  BUN 9 7  --  6  CREATININE 1.08 1.13 1.18 1.23  CALCIUM 9.0 9.2  --  9.3  MG 2.0  --   --   --    Liver Function Tests:  Recent Labs Lab 07/31/16 1407  AST 64*  ALT 133*  ALKPHOS 87  BILITOT 0.6  PROT 5.7*  ALBUMIN 3.4*   No results for input(s): LIPASE, AMYLASE in the last 168 hours. No results for input(s): AMMONIA in the last 168 hours. CBC:  Recent Labs Lab 07/31/16 1407  WBC 7.9  HGB 14.7  HCT 44.9  MCV 92.4  PLT 223   Cardiac Enzymes: No results for input(s): CKTOTAL, CKMB, CKMBINDEX, TROPONINI in the last 168 hours. BNP: Invalid input(s): POCBNP CBG: No results for input(s): GLUCAP in the last 168  hours. D-Dimer No results for input(s): DDIMER in the last 72 hours. Hgb A1c No results for input(s): HGBA1C in the last 72 hours. Lipid Profile No results for input(s): CHOL, HDL, LDLCALC, TRIG, CHOLHDL, LDLDIRECT in the last 72 hours. Thyroid function studies No results for input(s): TSH, T4TOTAL, T3FREE, THYROIDAB in the last 72 hours.  Invalid input(s): FREET3 Anemia work up No results for input(s): VITAMINB12, FOLATE, FERRITIN, TIBC, IRON, RETICCTPCT in the last 72 hours. Urinalysis    Component Value Date/Time   COLORURINE YELLOW 08/01/2016 0107   APPEARANCEUR CLEAR 08/01/2016 0107   LABSPEC 1.011 08/01/2016 0107  PHURINE 7.0 08/01/2016 0107   GLUCOSEU NEGATIVE 08/01/2016 0107   HGBUR SMALL (A) 08/01/2016 0107   BILIRUBINUR NEGATIVE 08/01/2016 0107   KETONESUR NEGATIVE 08/01/2016 0107   PROTEINUR NEGATIVE 08/01/2016 0107   UROBILINOGEN 0.2 07/16/2010 2240   NITRITE NEGATIVE 08/01/2016 0107   LEUKOCYTESUR NEGATIVE 08/01/2016 0107   Sepsis Labs Invalid input(s): PROCALCITONIN,  WBC,  LACTICIDVEN Microbiology Recent Results (from the past 240 hour(s))  MRSA PCR Screening     Status: None   Collection Time: 07/24/16 10:44 AM  Result Value Ref Range Status   MRSA by PCR NEGATIVE NEGATIVE Final    Comment:        The GeneXpert MRSA Assay (FDA approved for NASAL specimens only), is one component of a comprehensive MRSA colonization surveillance program. It is not intended to diagnose MRSA infection nor to guide or monitor treatment for MRSA infections.   Urine culture     Status: Abnormal   Collection Time: 07/27/16  9:52 PM  Result Value Ref Range Status   Specimen Description URINE, RANDOM  Final   Special Requests NONE  Final   Culture <10,000 COLONIES/mL INSIGNIFICANT GROWTH (A)  Final   Report Status 07/29/2016 FINAL  Final    Time coordinating discharge: Over 59 minutes  Vance Gather, MD  [facility] 08/02/2016, 10:54 AM Pager [phone]  If  7PM-7AM, please contact night-coverage [email or web address] Password [email or web address]

## 2016-08-02 NOTE — ED Notes (Addendum)
Patient transported to CT at this time via ED stretcher. Pt in no apparent distress at this time.   

## 2016-08-02 NOTE — Progress Notes (Signed)
Neurology Progress Note  Subjective: He is doing well this morning. He slept well overnight and has been eating, drinking, and voiding without difficulty. He reports hearing "jumbled" voices in his head yesterday but these are not present currently. He endorses some double vision when he is looking at his cell phone or watching the TV on the wall. He has not tried covering one eye so he is not sure how this affects his double vision. The double vision comes and goes and is not clearly related to fatigue. He has not identified any exacerbating factors. He has been ambulating around the unit but still feels unsteady on his feet. He reports some concern about his blood pressure this morning as it was measured at 85/58. He denies any symptoms with this blood pressure.   Current Meds:   Current Facility-Administered Medications:  .  acetaminophen (TYLENOL) tablet 650 mg, 650 mg, Oral, Q6H PRN, 650 mg at 07/24/16 1057 **OR** acetaminophen (TYLENOL) suppository 650 mg, 650 mg, Rectal, Q6H PRN, Samella Parr, NP .  camphor-menthol (SARNA) lotion, , Topical, PRN, Orson Eva, MD .  enoxaparin (LOVENOX) injection 40 mg, 40 mg, Subcutaneous, Q24H, Samella Parr, NP, 40 mg at 07/31/16 1034 .  folic acid (FOLVITE) tablet 1 mg, 1 mg, Oral, Daily, Ritta Slot, NP, 1 mg at 08/01/16 0905 .  multivitamin with minerals tablet 1 tablet, 1 tablet, Oral, Daily, Orson Eva, MD, 1 tablet at 08/01/16 0905 .  nicotine (NICODERM CQ - dosed in mg/24 hours) patch 21 mg, 21 mg, Transdermal, Daily, Samella Parr, NP, 21 mg at 08/01/16 0905 .  ondansetron (ZOFRAN) injection 4 mg, 4 mg, Intravenous, Q4H PRN, Orson Eva, MD, 4 mg at 07/30/16 2153 .  oxyCODONE (Oxy IR/ROXICODONE) immediate release tablet 5 mg, 5 mg, Oral, Q12H PRN, Patrecia Pour, MD, 5 mg at 08/02/16 0457 .  rOPINIRole (REQUIP) tablet 0.25 mg, 0.25 mg, Oral, TID, Patrecia Pour, MD, 0.25 mg at 08/01/16 2004 .  sodium chloride 0.9 % bolus 500 mL, 500 mL, Intravenous,  Once, Gardiner Barefoot, NP .  thiamine (VITAMIN B-1) tablet 100 mg, 100 mg, Oral, Daily, 100 mg at 08/01/16 0905 **OR** [DISCONTINUED] thiamine (B-1) injection 100 mg, 100 mg, Intravenous, Daily, Hannah Muthersbaugh, PA-C, 100 mg at 07/24/16 0443 .  traZODone (DESYREL) tablet 100 mg, 100 mg, Oral, QHS, Patrecia Pour, MD, 100 mg at 08/01/16 2004  Objective:  Temp:  [97.4 F (36.3 C)-98 F (36.7 C)] 98 F (36.7 C) (08/29 0611) Pulse Rate:  [63-89] 63 (08/29 0611) Resp:  [20] 20 (08/29 0611) BP: (85-105)/(45-63) 85/58 (08/29 0611) SpO2:  [95 %-99 %] 98 % (08/29 [id number])  General: WDWN Caucasian man in NAD. Alert, oriented x4. Speech is clear without dysarthria. Affect is bright. Comportment is normal.  HEENT: Neck is supple without lymphadenopathy. Mucous membranes are moist and the oropharynx is clear. Sclerae are anicteric. There is no conjunctival injection. EEG electrodes in place over scalp.  CV: Regular, no murmur. Carotid pulses are 2+ and symmetric with no bruits. Distal pulses 2+ and symmetric.  Lungs: CTAB  Extremities: No C/C/E. Neuro: MS: As noted above. No aphasia.  CN: Pupils are equal and reactive from 3-->2 mm bilaterally. EOMI, no nystagmus. He reports horizontal diplopia in neutral gaze and with vertical eye movements in the midline. This was not present prior to the exam and persists when he covers either eye. He also endorses some blurry vision. Facial sensation is intact to light touch. Face  is symmetric at rest with normal strength and mobility. Hearing is intact to conversational voice. Voice is normal in tone and quality. Palate elevates symmetrically. Uvula is midline. Bilateral SCM and trapezii are 5/5. Tongue is midline with normal bulk and mobility.  Motor: Normal bulk, tone, and strength throughout. No pronator drift. No tremor or other abnormal movements are observed.  Sensation: Intact to light touch.  DTRs: 2+, symmetric. Toes are downgoing bilaterally. No  pathological reflexes.  Coordination: Finger-to-nose and heel-to-shin are without dysmetria bilaterally.    Labs: Lab Results  Component Value Date   WBC 7.9 07/31/2016   HGB 14.7 07/31/2016   HCT 44.9 07/31/2016   PLT 223 07/31/2016   GLUCOSE 106 (H) 07/31/2016   ALT 133 (H) 07/31/2016   AST 64 (H) 07/31/2016   NA 137 07/31/2016   K 4.4 07/31/2016   CL 105 07/31/2016   CREATININE 1.23 07/31/2016   BUN 6 07/31/2016   CO2 26 07/31/2016   TSH 0.974 07/10/2016   CBC Latest Ref Rng & Units 07/31/2016 07/25/2016 07/24/2016  WBC 4.0 - 10.5 K/uL 7.9 5.3 9.3  Hemoglobin 13.0 - 17.0 g/dL 14.7 14.0 14.9  Hematocrit 39.0 - 52.0 % 44.9 42.8 44.5  Platelets 150 - 400 K/uL 223 207 258     No results found for: HGBA1C Lab Results  Component Value Date   ALT 133 (H) 07/31/2016   AST 64 (H) 07/31/2016   ALKPHOS 87 07/31/2016   BILITOT 0.6 07/31/2016    Radiology:  No new neuroimaging.   Other diagnostic studies: EEG from 08/01/16 normal  EEG from 07/11/16 normal  A/P:   1. Possible seizure: The spell that he had on 07/30/16 does not sound typical of an epileptic seizure. I cannot exclude the possibility that he has had seizures in the past but by reports these have only happened in the setting of alcohol withdrawal. He has now had two normal EEGs and a normal MRI scan of the brain. There is no role for AED therapy at this time. I discussed the importance of sustained abstinence from both alcohol and cocaine to minimize his risk of additional seizures in the future. He verbalized understanding.  2. Binocular diplopia: He has intermittent horizontal diplopia in the midline that is not affected by distance and that does not improve when he covers either eye. This is suggestive of refractive errors in the eyes. 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 I have recommended that if his diplopia persists after discharge that he be evaluated by an optometrist  for possible corrective lenses.   3.  Alcohol withdrawal: This has resolved with no clinical evidence of ongoing withdrawal this morning. His last dose of Ativan was 1 mg PO at 0005 this morning, completing his taper.   4. Alcohol abuse: He has a long history of heavy alcohol use, drinking 2-3 gallons of vodka daily. He states that he started drinking in 2009 following his divorce and has been a heavy drinker ever since. His longest period of sobriety was 6 months in 2012. At this point, sustained alcohol cessation will be the best way to prevent any further seizure activity. A formal alcohol rehabilitation program is recommended, preferably one that deals with polysubstance abuse given his cocaine abuse.   This was discussed with the patient and he is in agreement with the plan as noted. He was given the chance to ask any questions and these were addressed to his satisfaction. I have  no further recommendations at this time and will sign off.   Melba Coon, MD [facility]

## 2016-08-03 LAB — RAPID URINE DRUG SCREEN, HOSP PERFORMED
AMPHETAMINES: NOT DETECTED
BENZODIAZEPINES: POSITIVE — AB
Barbiturates: NOT DETECTED
Cocaine: POSITIVE — AB
OPIATES: NOT DETECTED
Tetrahydrocannabinol: NOT DETECTED

## 2017-08-04 IMAGING — CT CT HEAD W/O CM
3 of 7 series · 16 of 47 positions shown, 19 images · non-contrast
Comparison: Brain MRI 07/31/2016

CLINICAL DATA: Seizure. Status post fall. Currently in alcohol
detox.

EXAM:
CT HEAD WITHOUT CONTRAST
CT MAXILLOFACIAL WITHOUT CONTRAST
TECHNIQUE: Multidetector CT imaging of the head and maxillofacial structures
were performed using the standard protocol without intravenous
contrast. Multiplanar CT image reconstructions of the maxillofacial
structures were also generated.

[Series 5: head without cor · coronal · non-contrast · 0.32mm/px · 3 of 73 slices shown]
[im 22/73  brain]
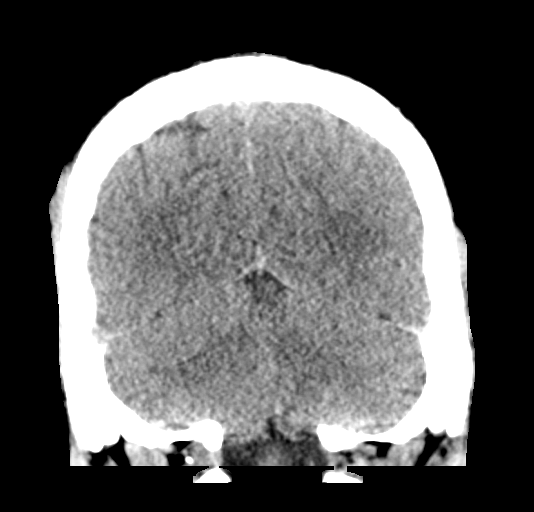
[im 43/73  brain]
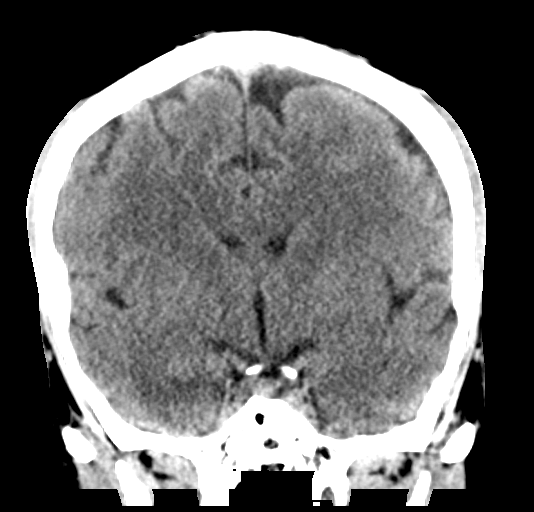
[im 64/73  brain]
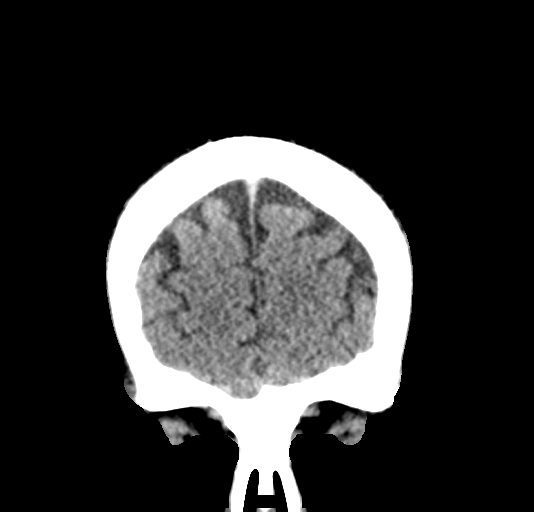

[Series 7: facialbone 2.0 st · axial · 0.40mm/px · z∈[-228,-80]mm · 11 of 90 slices shown, 14 images]
[im 8/90  brain]
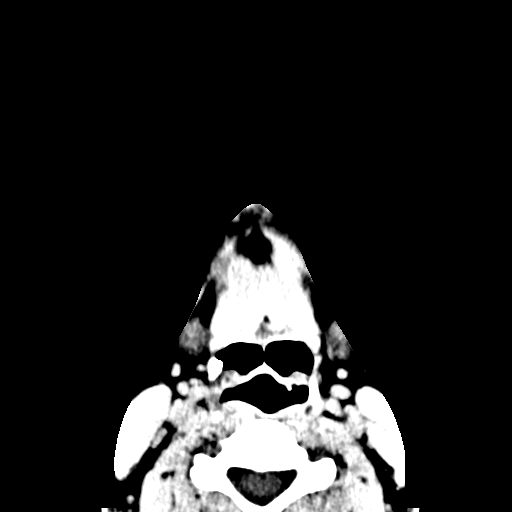
[im 8/90  bone]
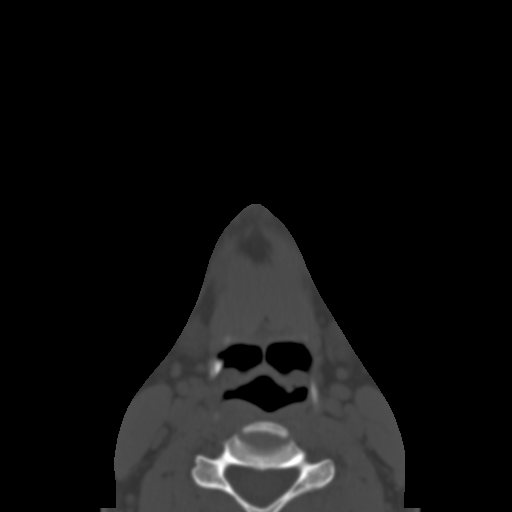
[im 15/90  brain]
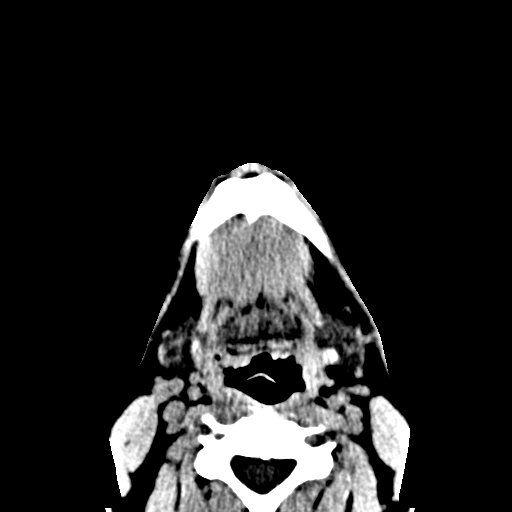
[im 23/90  brain]
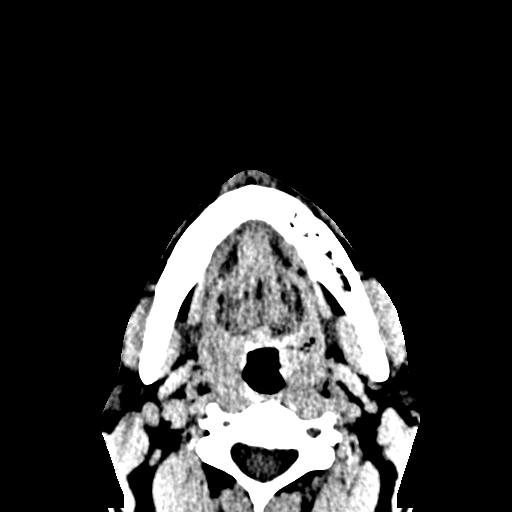
[im 30/90  brain]
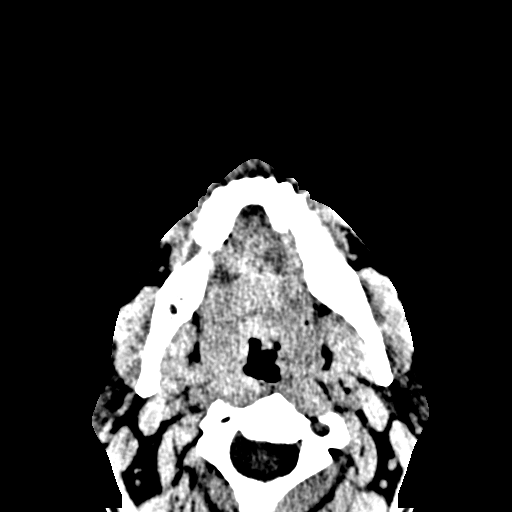
[im 38/90  brain]
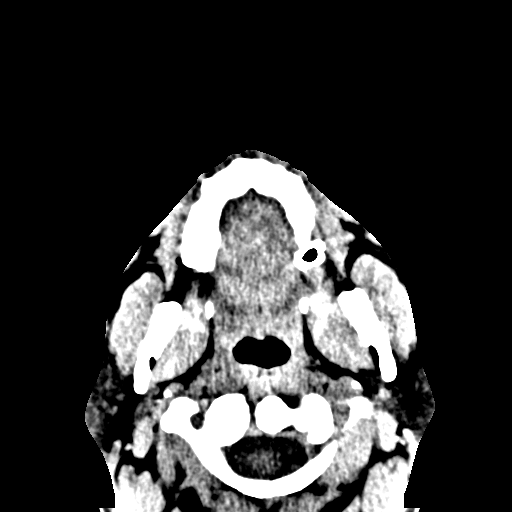
[im 38/90  bone]
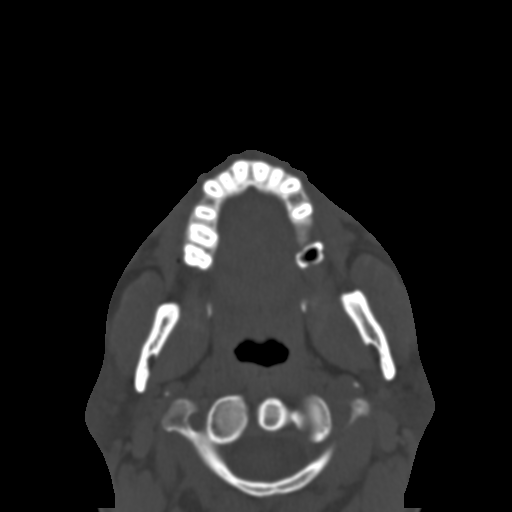
[im 45/90  brain]
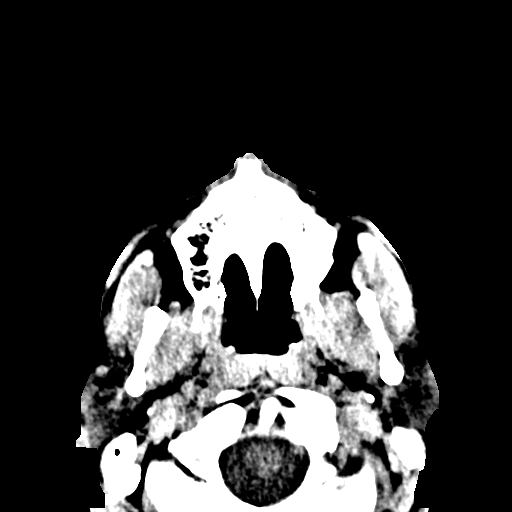
[im 52/90  brain]
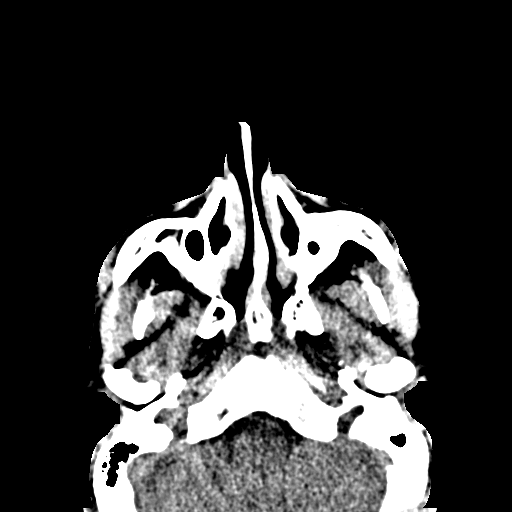
[im 60/90  brain]
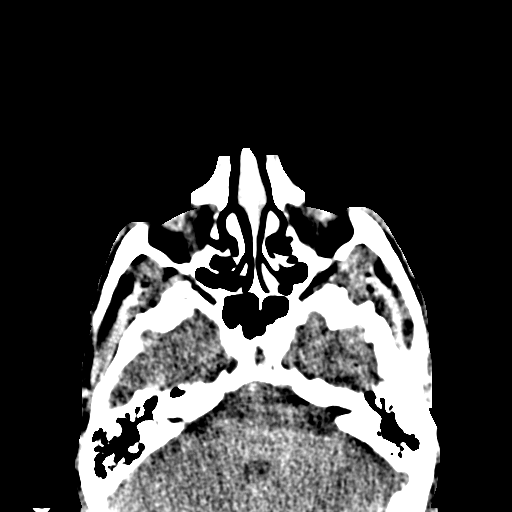
[im 67/90  brain]
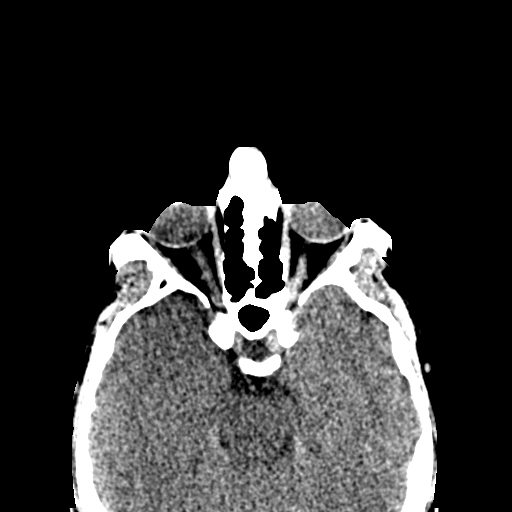
[im 67/90  bone]
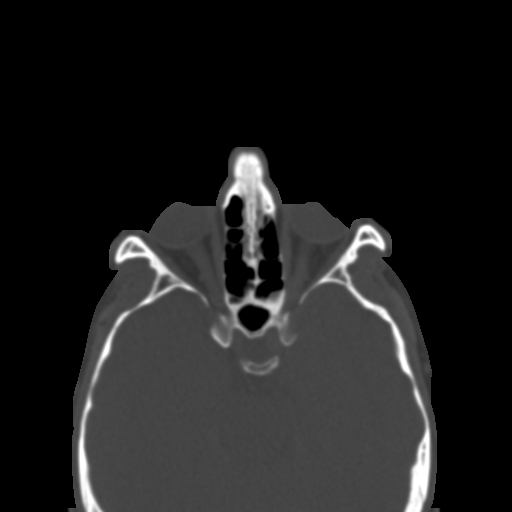
[im 75/90  brain]
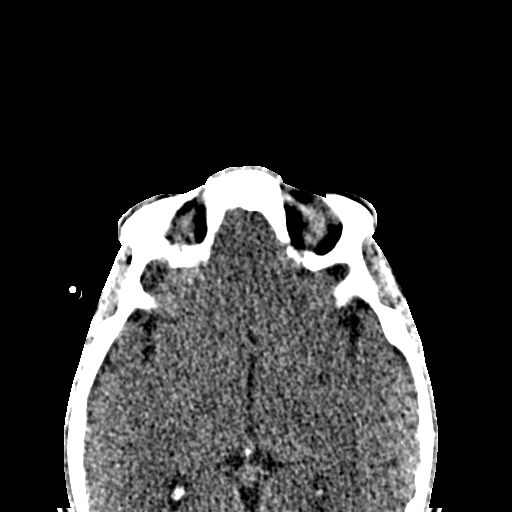
[im 82/90  brain]
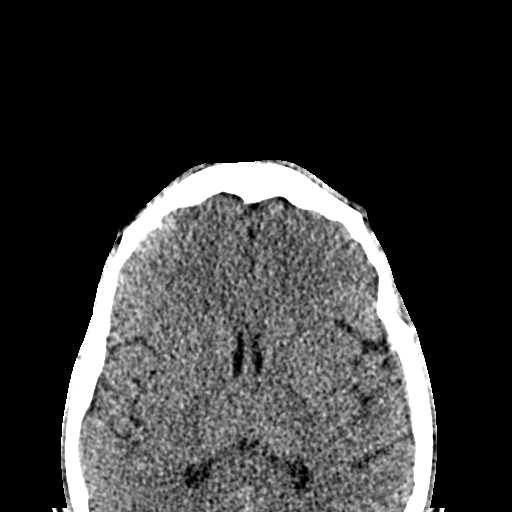

[Series 12: facialbone 2.0 sag st · sagittal · 0.35mm/px · 2 of 91 slices shown]
[im 31/91  brain]
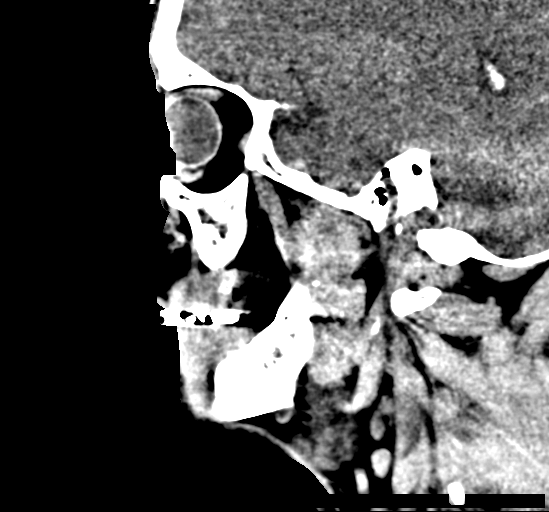
[im 61/91  brain]
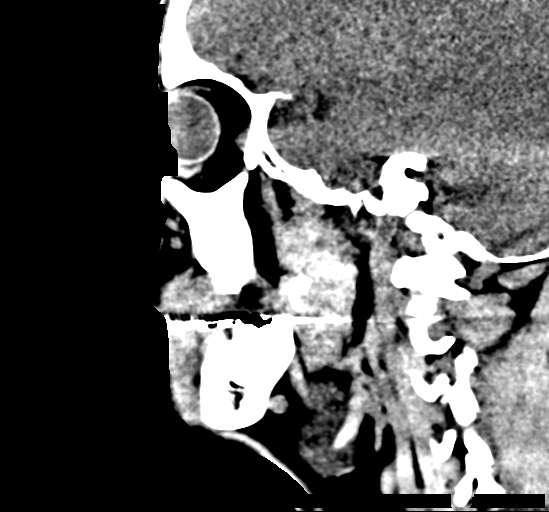

[16 of 47 positions shown; findings below may reference images not displayed]

FINDINGS: CT HEAD FINDINGS

Brain: There is no mass lesion, intraparenchymal hemorrhage or
extra-axial collection. No evidence of acute cortical infarct. No
hydrocephalus. Brain parenchyma and CSF-containing spaces are normal
for age.

Vascular: No hyperdense vessel or unexpected calcification.

CT MAXILLOFACIAL FINDINGS

Complex facial fracture types: No LeFort, zygomaticomaxillary
complex or nasoorbitoethmoidal fracture.

Simple fracture types: None.

Orbits: Globes appear intact. Normal intra- and extraconal fat.

Paranasal sinuses and mastoids: Free of fluid. Hypoplastic maxillary
sinuses.

Calvarium and skull base No fracture identified.

Extracranial soft tissues: Bilateral cervical level 2 lymph nodes
measuring up to 1.4 cm.
IMPRESSION: 1. No acute intracranial abnormality.  Normal head CT.
2. No acute facial fracture.
3. Bilateral mildly enlarged cervical lymph nodes.

## 2018-02-08 ENCOUNTER — Emergency Department: Admit: 2018-02-09 | Payer: Self-pay

## 2018-02-08 DIAGNOSIS — R1031 Right lower quadrant pain: Secondary | ICD-10-CM

## 2018-02-08 NOTE — ED Triage Notes (Signed)
Right upper quadrant pain x 1.5 hours with nausea and vomiting.

## 2018-02-08 NOTE — ED Provider Notes (Signed)
37 year old male presents with sudden onset severe right-sided sharp lower quadrant abdominal pain for the past 2 hours.  He reports multiple episodes of vomiting.  He has been having normal bowel movements.  Denies fevers or chills.  He has history of kidney stones, but states it feels different.  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             Past Medical History:   Diagnosis Date   ??? Cancer ([place])     testiclar cancer   ??? Ill-defined condition     restless legs       Past Surgical History:   Procedure Laterality Date   ??? HX OTHER SURGICAL      kidney stones removal.   ??? HX UROLOGICAL      left testicle removal.         History reviewed. No pertinent family history.    Social History     Socioeconomic History   ??? Marital status: DIVORCED     Spouse name: Not on file   ??? Number of children: Not on file   ??? Years of education: Not on file   ??? Highest education level: Not on file   Social Needs   ??? Financial resource strain: Not on file   ??? Food insecurity - worry: Not on file   ??? Food insecurity - inability: Not on file   ??? Transportation needs - medical: Not on file   ??? Transportation needs - non-medical: Not on file   Occupational History   ??? Not on file   Tobacco Use   ??? Smoking status: Current Every Day Smoker     Packs/day: 0.50   Substance and Sexual Activity   ??? Alcohol use: No   ??? Drug use: Not on file   ??? Sexual activity: Not on file   Other Topics Concern   ??? Not on file   Social History Narrative   ??? Not on file         ALLERGIES: Tramadol    Review of Systems   Constitutional: Negative for chills and fever.   HENT: Negative for hearing loss.    Eyes: Negative for visual disturbance.   Respiratory: Negative for cough and shortness of breath.    Cardiovascular: Negative for chest pain and palpitations.   Gastrointestinal: Positive for abdominal pain, nausea and vomiting. Negative for diarrhea.   Genitourinary: Negative for difficulty urinating, dysuria and scrotal swelling.    Musculoskeletal: Negative for back pain.   Skin: Negative for rash.   Neurological: Negative for weakness and headaches.   Psychiatric/Behavioral: Negative for confusion.       Vitals:    02/08/18 2034   BP: 130/88   Pulse: (!) 115   Resp: 16   Temp: 98.6 ??F (37 ??C)   SpO2: 96%   Weight: 90.7 kg (200 lb)   Height: 5' 10" (1.778 m)            Physical Exam   Constitutional: He appears well-developed and well-nourished.   Appears in pain, holding right side, rocking in bed   HENT:   Head: Normocephalic and atraumatic.   Right Ear: External ear normal.   Left Ear: External ear normal.   Nose: Nose normal.   Mouth/Throat: Oropharynx is clear and moist.   Eyes: Conjunctivae are normal. Pupils are equal, round, and reactive to light.   Neck: Normal range of motion. Neck supple.   Cardiovascular: Regular rhythm, normal heart  sounds and intact distal pulses. Tachycardia present.   Pulmonary/Chest: Effort normal and breath sounds normal. No respiratory distress. He has no wheezes.   Abdominal: Soft. Bowel sounds are normal. He exhibits no distension. There is tenderness in the right lower quadrant. There is tenderness at McBurney's point. There is no rigidity and no CVA tenderness.   Musculoskeletal: Normal range of motion. He exhibits no edema.   Neurological: He is alert.   Skin: Skin is warm and dry.   Psychiatric: Judgment normal.   Nursing note and vitals reviewed.       MDM  Number of Diagnoses or Management Options  Diagnosis management comments: Parts of this document were created using dragon voice recognition software. The chart has been reviewed but errors may still be present.    11:15 PM  No stone and normal appendix.  Labs and urine unremarkable.  Patient still reporting severe pain.  He lives in [place].  He previously had multiple visits to the emergency department for flank pain a few years ago.  I do not suspect other causes of surgical abdomen at this time.  He had no  improvement with Toradol.  We'll give Dilaudid and discharge with Levsin.     I discussed the results of all labs, procedures, radiographs, and treatments with the patient and available family.  Treatment plan is agreed upon and the patient is ready for discharge.  Questions about treatment in the ED and differential diagnosis of presenting condition were answered.  Patient was given verbal discharge instructions including, but not limited to, importance of returning to the emergency department for any concern of worsening or continued symptoms.  Instructions were given to follow up with a primary care provider or specialist within 1-2 days.  Adverse effects of medications, if prescribed, were discussed and patient was advised to refrain from significant physical activity until followed up by primary care physician and to not drive or operate heavy machinery after taking any sedating substances.           Amount and/or Complexity of Data Reviewed  Clinical lab tests: reviewed and ordered (Results for orders placed or performed during the hospital encounter of 02/08/18  -CBC WITH AUTOMATED DIFF       Result                      Value             Ref Range           WBC                         7.2               4.3 - 11.1 K*       RBC                         4.75              4.23 - 5.6 M*       HGB                         14.6              13.6 - 17.2 *       HCT  44.0              41.1 - 50.3 %       MCV                         92.6              79.6 - 97.8 *       MCH                         30.7              26.1 - 32.9 *       MCHC                        33.2              31.4 - 35.0 *       RDW                         13.2              11.9 - 14.6 %       PLATELET                    285               150 - 450 K/*       MPV                         10.7              9.4 - 12.3 FL       ABSOLUTE NRBC               0.00              0.0 - 0.2 K/*        DF                          AUTOMATED                             NEUTROPHILS                 69                43 - 78 %           LYMPHOCYTES                 23                13 - 44 %           MONOCYTES                   6                 4.0 - 12.0 %        EOSINOPHILS                 1                 0.5 - 7.8 %         BASOPHILS  1                 0.0 - 2.0 %         IMMATURE GRANULOCYTES       0                 0.0 - 5.0 %         ABS. NEUTROPHILS            5.0               1.7 - 8.2 K/*       ABS. LYMPHOCYTES            1.6               0.5 - 4.6 K/*       ABS. MONOCYTES              0.5               0.1 - 1.3 K/*       ABS. EOSINOPHILS            0.1               0.0 - 0.8 K/*       ABS. BASOPHILS              0.0               0.0 - 0.2 K/*       ABS. IMM. GRANS.            0.0               0.0 - 0.5 K/*  -METABOLIC PANEL, COMPREHENSIVE       Result                      Value             Ref Range           Sodium                      141               136 - 145 mm*       Potassium                   3.3 (L)           3.5 - 5.1 mm*       Chloride                    106               98 - 107 mmo*       CO2                         24                21 - 32 mmol*       Anion gap                   11                7 - 16 mmol/L       Glucose  151 (H)           65 - 100 mg/*       BUN                         9                 6 - 23 MG/DL        Creatinine                  1.27              0.8 - 1.5 MG*       GFR est AA                  >60               >60 ml/min/1*       GFR est non-AA              >60               >60 ml/min/1*       Calcium                     8.5               8.3 - 10.4 M*       Bilirubin, total            0.3               0.2 - 1.1 MG*       ALT (SGPT)                  26                12 - 65 U/L         AST (SGOT)                  14 (L)            15 - 37 U/L         Alk. phosphatase            81                50 - 136 U/L         Protein, total              6.4               6.3 - 8.2 g/*       Albumin                     3.4 (L)           3.5 - 5.0 g/*       Globulin                    3.0               2.3 - 3.5 g/*       A-G Ratio                   1.1 (L)           1.2 - 3.5      -LIPASE       Result  Value             Ref Range           Lipase                      182               73 - 393 U/L   )  Tests in the radiology section of CPT??: ordered and reviewed (Ct Urogram Wo Cont    Result Date: 02/08/2018  CT ABDOMEN AND PELVIS WITHOUT CONTRAST. HISTORY: Right-sided abdominal pain. History of urinary tract calculi.. COMPARISON: November 2016. TECHNIQUE:  49m axial images from above the kidneys through the bladder base using renal stone protocol.  Radiation dose reduction techniques were used for this study.  Our CT scanners use one or more of the following:  Automated exposure control, adjustment of the mA and or kV according to patient size, iterative reconstruction. FINDINGS:  Abdomen:  No radiopaque urinary tract calculi in the right kidney, left kidney or upper ureters.   No hydronephrosis or hydroureter. No acute perinephric or periureteral stranding densities.  Included portion of the liver and spleen are unremarkable.  Aorta normal caliber. No free air. Pelvis: No calcified stones in the lower ureters or within the urinary bladder. Prostate and seminal vesicles unremarkable. There is a long normal appendix.     IMPRESSION:  Limited noncontrast exam negative for radiopaque urinary tract calculi or evidence of obstruction.     )  Tests in the medicine section of CPT??: ordered and reviewed           Procedures

## 2018-02-09 ENCOUNTER — Inpatient Hospital Stay
Admit: 2018-02-09 | Discharge: 2018-02-09 | Disposition: A | Discharge status: Home / Self Care | Payer: Self-pay | Attending: Emergency Medicine

## 2018-02-09 LAB — METABOLIC PANEL, COMPREHENSIVE
A-G Ratio: 1.1 — ABNORMAL LOW (ref 1.2–3.5)
ALT (SGPT): 26 U/L (ref 12–65)
AST (SGOT): 14 U/L — ABNORMAL LOW (ref 15–37)
Albumin: 3.4 g/dL — ABNORMAL LOW (ref 3.5–5.0)
Alk. phosphatase: 81 U/L (ref 50–136)
Anion gap: 11 mmol/L (ref 7–16)
BUN: 9 MG/DL (ref 6–23)
Bilirubin, total: 0.3 MG/DL (ref 0.2–1.1)
CO2: 24 mmol/L (ref 21–32)
Calcium: 8.5 MG/DL (ref 8.3–10.4)
Chloride: 106 mmol/L (ref 98–107)
Creatinine: 1.27 MG/DL (ref 0.8–1.5)
GFR est AA: >60 mL/min/{1.73_m2} (ref >60)
GFR est non-AA: >60 mL/min/{1.73_m2} (ref >60)
Globulin: 3 g/dL (ref 2.3–3.5)
Glucose: 151 mg/dL — ABNORMAL HIGH (ref 65–100)
Potassium: 3.3 mmol/L — ABNORMAL LOW (ref 3.5–5.1)
Protein, total: 6.4 g/dL (ref 6.3–8.2)
Sodium: 141 mmol/L (ref 136–145)

## 2018-02-09 LAB — CBC WITH AUTOMATED DIFF
ABS. BASOPHILS: 0 10*3/uL (ref 0.0–0.2)
ABS. EOSINOPHILS: 0.1 10*3/uL (ref 0.0–0.8)
ABS. IMM. GRANS.: 0 10*3/uL (ref 0.0–0.5)
ABS. LYMPHOCYTES: 1.6 10*3/uL (ref 0.5–4.6)
ABS. MONOCYTES: 0.5 10*3/uL (ref 0.1–1.3)
ABS. NEUTROPHILS: 5 10*3/uL (ref 1.7–8.2)
ABSOLUTE NRBC: 0 10*3/uL (ref 0.0–0.2)
BASOPHILS: 1 % (ref 0.0–2.0)
EOSINOPHILS: 1 % (ref 0.5–7.8)
HCT: 44 % (ref 41.1–50.3)
HGB: 14.6 g/dL (ref 13.6–17.2)
IMMATURE GRANULOCYTES: 0 % (ref 0.0–5.0)
LYMPHOCYTES: 23 % (ref 13–44)
MCH: 30.7 PG (ref 26.1–32.9)
MCHC: 33.2 g/dL (ref 31.4–35.0)
MCV: 92.6 FL (ref 79.6–97.8)
MONOCYTES: 6 % (ref 4.0–12.0)
MPV: 10.7 FL (ref 9.4–12.3)
NEUTROPHILS: 69 % (ref 43–78)
PLATELET: 285 10*3/uL (ref 150–450)
RBC: 4.75 M/uL (ref 4.23–5.6)
RDW: 13.2 % (ref 11.9–14.6)
WBC: 7.2 10*3/uL (ref 4.3–11.1)

## 2018-02-09 LAB — LIPASE: Lipase: 182 U/L (ref 73–393)

## 2018-02-09 MED ORDER — HYDROMORPHONE (PF) 1 MG/ML IJ SOLN
1 mg/mL | INTRAMUSCULAR | Status: AC
Start: 2018-02-09 — End: 2018-02-08
  Administered 2018-02-09: 05:00:00 via INTRAVENOUS

## 2018-02-09 MED ORDER — HYOSCYAMINE 0.125 MG SUBLINGUAL TAB
0.125 mg | ORAL_TABLET | SUBLINGUAL | 0 refills | Status: AC | PRN
Start: 2018-02-09 — End: ?

## 2018-02-09 MED ORDER — ONDANSETRON (PF) 4 MG/2 ML INJECTION
4 mg/2 mL | INTRAMUSCULAR | Status: AC
Start: 2018-02-09 — End: 2018-02-08
  Administered 2018-02-09: 04:00:00 via INTRAVENOUS

## 2018-02-09 MED ORDER — SODIUM CHLORIDE 0.9 % IJ SYRG
INTRAMUSCULAR | Status: DC | PRN
Start: 2018-02-09 — End: 2018-02-09

## 2018-02-09 MED ORDER — KETOROLAC TROMETHAMINE 30 MG/ML INJECTION
30 mg/mL (1 mL) | INTRAMUSCULAR | Status: AC
Start: 2018-02-09 — End: 2018-02-08
  Administered 2018-02-09: 04:00:00 via INTRAVENOUS

## 2018-02-09 MED ORDER — SODIUM CHLORIDE 0.9 % IJ SYRG
Freq: Three times a day (TID) | INTRAMUSCULAR | Status: DC
Start: 2018-02-09 — End: 2018-02-09
  Administered 2018-02-09: 05:00:00 via INTRAVENOUS

## 2018-02-09 MED ORDER — ONDANSETRON 4 MG TAB, RAPID DISSOLVE
4 mg | ORAL_TABLET | Freq: Three times a day (TID) | ORAL | 0 refills | Status: AC | PRN
Start: 2018-02-09 — End: ?

## 2018-02-09 MED ORDER — METOCLOPRAMIDE 5 MG/ML IJ SOLN
5 mg/mL | INTRAMUSCULAR | Status: AC
Start: 2018-02-09 — End: 2018-02-08
  Administered 2018-02-09: 05:00:00 via INTRAVENOUS

## 2018-02-09 MED FILL — ONDANSETRON (PF) 4 MG/2 ML INJECTION: 4 mg/2 mL | INTRAMUSCULAR | Qty: 2

## 2018-02-09 MED FILL — METOCLOPRAMIDE 5 MG/ML IJ SOLN: 5 mg/mL | INTRAMUSCULAR | Qty: 2

## 2018-02-09 MED FILL — HYDROMORPHONE (PF) 1 MG/ML IJ SOLN: 1 mg/mL | INTRAMUSCULAR | Qty: 1

## 2018-02-09 MED FILL — KETOROLAC TROMETHAMINE 30 MG/ML INJECTION: 30 mg/mL (1 mL) | INTRAMUSCULAR | Qty: 1

## 2018-02-09 NOTE — ED Notes (Signed)
I have reviewed discharge instructions with the patient.  The patient verbalized understanding.    Patient left ED via Discharge Method: ambulatory to Home with [facility].    Opportunity for questions and clarification provided.       Patient given 2 scripts.         To continue your aftercare when you leave the hospital, you may receive an automated call from our care team to check in on how you are doing.  This is a free service and part of our promise to provide the best care and service to meet your aftercare needs.??? If you have questions, or wish to unsubscribe from this service please call [phone].  Thank you [facility] Choosing [facility].

## 2019-08-06 DEATH — deceased
